# Patient Record
Sex: Female | Born: 1935 | Race: White | Hispanic: No | Marital: Single | Smoking: Never smoker
Health system: Southern US, Community
[De-identification: ages and names within clinical notes are randomized; demographics above are authoritative.]

## PROBLEM LIST (undated history)

## (undated) DIAGNOSIS — I10 Essential (primary) hypertension: Secondary | ICD-10-CM

## (undated) DIAGNOSIS — E7800 Pure hypercholesterolemia, unspecified: Secondary | ICD-10-CM

---

## 2016-01-30 DIAGNOSIS — J019 Acute sinusitis, unspecified: Secondary | ICD-10-CM | POA: Diagnosis not present

## 2016-02-14 DIAGNOSIS — I10 Essential (primary) hypertension: Secondary | ICD-10-CM | POA: Diagnosis not present

## 2016-02-14 DIAGNOSIS — E1165 Type 2 diabetes mellitus with hyperglycemia: Secondary | ICD-10-CM | POA: Diagnosis not present

## 2016-02-14 DIAGNOSIS — Z6827 Body mass index (BMI) 27.0-27.9, adult: Secondary | ICD-10-CM | POA: Diagnosis not present

## 2016-02-14 DIAGNOSIS — Z789 Other specified health status: Secondary | ICD-10-CM | POA: Diagnosis not present

## 2016-02-23 DIAGNOSIS — H40053 Ocular hypertension, bilateral: Secondary | ICD-10-CM | POA: Diagnosis not present

## 2016-03-15 DIAGNOSIS — H2513 Age-related nuclear cataract, bilateral: Secondary | ICD-10-CM | POA: Diagnosis not present

## 2016-03-15 DIAGNOSIS — H2512 Age-related nuclear cataract, left eye: Secondary | ICD-10-CM | POA: Diagnosis not present

## 2016-03-19 NOTE — Patient Instructions (Signed)
Your procedure is scheduled on:  03/26/2016   Report to [facility] at  75   AM.  Call this number if you have problems the morning of surgery: [phone]   Do not eat food or drink liquids :After Midnight.      Take these medicines the morning of surgery with A SIP OF WATER: none   Do not wear jewelry, make-up or nail polish.  Do not wear lotions, powders, or perfumes. You may wear deodorant.  Do not shave 48 hours prior to surgery.  Do not bring valuables to the hospital.  Contacts, dentures or bridgework may not be worn into surgery.  Leave suitcase in the car. After surgery it may be brought to your room.  For patients admitted to the hospital, checkout time is 11:00 AM the day of discharge.   Patients discharged the day of surgery will not be allowed to drive home.  :     Please read over the following fact sheets that you were given: Coughing and Deep Breathing, Surgical Site Infection Prevention, Anesthesia Post-op Instructions and Care and Recovery After Surgery    Cataract A cataract is a clouding of the lens of the eye. When a lens becomes cloudy, vision is reduced based on the degree and nature of the clouding. Many cataracts reduce vision to some degree. Some cataracts make people more near-sighted as they develop. Other cataracts increase glare. Cataracts that are ignored and become worse can sometimes look white. The white color can be seen through the pupil. CAUSES   Aging. However, cataracts may occur at any age, even in newborns.   Certain drugs.   Trauma to the eye.   Certain diseases such as diabetes.   Specific eye diseases such as chronic inflammation inside the eye or a sudden attack of a rare form of glaucoma.   Inherited or acquired medical problems.  SYMPTOMS   Gradual, progressive drop in vision in the affected eye.   Severe, rapid visual loss. This most often happens when trauma is the cause.  DIAGNOSIS  To detect a cataract, an eye doctor  examines the lens. Cataracts are best diagnosed with an exam of the eyes with the pupils enlarged (dilated) by drops.  TREATMENT  For an early cataract, vision may improve by using different eyeglasses or stronger lighting. If that does not help your vision, surgery is the only effective treatment. A cataract needs to be surgically removed when vision loss interferes with your everyday activities, such as driving, reading, or watching TV. A cataract may also have to be removed if it prevents examination or treatment of another eye problem. Surgery removes the cloudy lens and usually replaces it with a substitute lens (intraocular lens, IOL).  At a time when both you and your doctor agree, the cataract will be surgically removed. If you have cataracts in both eyes, only one is usually removed at a time. This allows the operated eye to heal and be out of danger from any possible problems after surgery (such as infection or poor wound healing). In rare cases, a cataract may be doing damage to your eye. In these cases, your caregiver may advise surgical removal right away. The vast majority of people who have cataract surgery have better vision afterward. HOME CARE INSTRUCTIONS  If you are not planning surgery, you may be asked to do the following:  Use different eyeglasses.   Use stronger or brighter lighting.   Ask your eye doctor about reducing your  medicine dose or changing medicines if it is thought that a medicine caused your cataract. Changing medicines does not make the cataract go away on its own.   Become familiar with your surroundings. Poor vision can lead to injury. Avoid bumping into things on the affected side. You are at a higher risk for tripping or falling.   Exercise extreme care when driving or operating machinery.   Wear sunglasses if you are sensitive to bright light or experiencing problems with glare.  SEEK IMMEDIATE MEDICAL CARE IF:   You have a worsening or sudden vision  loss.   You notice redness, swelling, or increasing pain in the eye.   You have a fever.  Document Released: 12/03/2005 Document Revised: 11/22/2011 Document Reviewed: 07/27/2011 [facility] Patient Information 2012 [employer].PATIENT INSTRUCTIONS POST-ANESTHESIA  IMMEDIATELY FOLLOWING SURGERY:  Do not drive or operate machinery for the first twenty four hours after surgery.  Do not make any important decisions for twenty four hours after surgery or while taking narcotic pain medications or sedatives.  If you develop intractable nausea and vomiting or a severe headache please notify your doctor immediately.  FOLLOW-UP:  Please make an appointment with your surgeon as instructed. You do not need to follow up with anesthesia unless specifically instructed to do so.  WOUND CARE INSTRUCTIONS (if applicable):  Keep a dry clean dressing on the anesthesia/puncture wound site if there is drainage.  Once the wound has quit draining you may leave it open to air.  Generally you should leave the bandage intact for twenty four hours unless there is drainage.  If the epidural site drains for more than 36-48 hours please call the anesthesia department.  QUESTIONS?:  Please feel free to call your physician or the hospital operator if you have any questions, and they will be happy to assist you.

## 2016-03-20 ENCOUNTER — Encounter (HOSPITAL_COMMUNITY)
Admission: RE | Admit: 2016-03-20 | Discharge: 2016-03-20 | Disposition: A | Discharge status: Home / Self Care | Payer: Medicare Other | Source: Ambulatory Visit | Attending: Ophthalmology | Admitting: Ophthalmology

## 2016-03-20 ENCOUNTER — Encounter (HOSPITAL_COMMUNITY): Payer: Self-pay

## 2016-03-20 ENCOUNTER — Other Ambulatory Visit: Payer: Self-pay

## 2016-03-20 DIAGNOSIS — I10 Essential (primary) hypertension: Secondary | ICD-10-CM | POA: Diagnosis not present

## 2016-03-20 DIAGNOSIS — H269 Unspecified cataract: Secondary | ICD-10-CM | POA: Insufficient documentation

## 2016-03-20 DIAGNOSIS — Z01812 Encounter for preprocedural laboratory examination: Secondary | ICD-10-CM | POA: Diagnosis not present

## 2016-03-20 DIAGNOSIS — E119 Type 2 diabetes mellitus without complications: Secondary | ICD-10-CM | POA: Diagnosis not present

## 2016-03-20 DIAGNOSIS — Z01810 Encounter for preprocedural cardiovascular examination: Secondary | ICD-10-CM | POA: Diagnosis not present

## 2016-03-20 HISTORY — DX: Pure hypercholesterolemia, unspecified: E78.00

## 2016-03-20 HISTORY — DX: Essential (primary) hypertension: I10

## 2016-03-20 LAB — BASIC METABOLIC PANEL
Anion gap: 9 (ref 5–15)
BUN: 26 mg/dL — AB (ref 6–20)
CALCIUM: 8.8 mg/dL — AB (ref 8.9–10.3)
CHLORIDE: 108 mmol/L (ref 101–111)
CO2: 24 mmol/L (ref 22–32)
CREATININE: 1.23 mg/dL — AB (ref 0.44–1.00)
GFR calc non Af Amer: 39 mL/min — ABNORMAL LOW (ref >60)
GFR, EST AFRICAN AMERICAN: 45 mL/min — AB (ref >60)
Glucose, Bld: 81 mg/dL (ref 65–99)
Potassium: 3.7 mmol/L (ref 3.5–5.1)
SODIUM: 141 mmol/L (ref 135–145)

## 2016-03-20 LAB — CBC
HCT: 37.9 % (ref 36.0–46.0)
Hemoglobin: 13.2 g/dL (ref 12.0–15.0)
MCH: 32.2 pg (ref 26.0–34.0)
MCHC: 34.8 g/dL (ref 30.0–36.0)
MCV: 92.4 fL (ref 78.0–100.0)
PLATELETS: 255 10*3/uL (ref 150–400)
RBC: 4.1 MIL/uL (ref 3.87–5.11)
RDW: 13.3 % (ref 11.5–15.5)
WBC: 9.1 10*3/uL (ref 4.0–10.5)

## 2016-03-26 ENCOUNTER — Encounter (HOSPITAL_COMMUNITY)
Admission: RE | Disposition: A | Discharge status: Home / Self Care | Payer: Self-pay | Source: Ambulatory Visit | Attending: Ophthalmology

## 2016-03-26 ENCOUNTER — Ambulatory Visit (HOSPITAL_COMMUNITY): Payer: Medicare Other | Admitting: Anesthesiology

## 2016-03-26 ENCOUNTER — Ambulatory Visit (HOSPITAL_COMMUNITY)
Admission: RE | Admit: 2016-03-26 | Discharge: 2016-03-26 | Disposition: A | Discharge status: Home / Self Care | Payer: Medicare Other | Source: Ambulatory Visit | Attending: Ophthalmology | Admitting: Ophthalmology

## 2016-03-26 DIAGNOSIS — Z7982 Long term (current) use of aspirin: Secondary | ICD-10-CM | POA: Diagnosis not present

## 2016-03-26 DIAGNOSIS — E119 Type 2 diabetes mellitus without complications: Secondary | ICD-10-CM | POA: Insufficient documentation

## 2016-03-26 DIAGNOSIS — H2512 Age-related nuclear cataract, left eye: Secondary | ICD-10-CM | POA: Insufficient documentation

## 2016-03-26 DIAGNOSIS — I10 Essential (primary) hypertension: Secondary | ICD-10-CM | POA: Diagnosis not present

## 2016-03-26 DIAGNOSIS — Z79899 Other long term (current) drug therapy: Secondary | ICD-10-CM | POA: Insufficient documentation

## 2016-03-26 DIAGNOSIS — H269 Unspecified cataract: Secondary | ICD-10-CM | POA: Diagnosis not present

## 2016-03-26 HISTORY — PX: CATARACT EXTRACTION W/PHACO: SHX586

## 2016-03-26 SURGERY — PHACOEMULSIFICATION, CATARACT, WITH IOL INSERTION
Anesthesia: Monitor Anesthesia Care | Site: Eye | Laterality: Left

## 2016-03-26 MED ORDER — LIDOCAINE 3.5 % OP GEL OPTIME - NO CHARGE
OPHTHALMIC | Status: DC | PRN
  Administered 2016-03-26: 2 [drp] via OPHTHALMIC

## 2016-03-26 MED ORDER — BSS IO SOLN
INTRAOCULAR | Status: DC | PRN
  Administered 2016-03-26: 15 mL

## 2016-03-26 MED ORDER — POVIDONE-IODINE 5 % OP SOLN
OPHTHALMIC | Status: DC | PRN
Start: 2016-03-26 — End: 2016-03-26
  Administered 2016-03-26: 1 via OPHTHALMIC

## 2016-03-26 MED ORDER — NEOMYCIN-POLYMYXIN-DEXAMETH 3.5-10000-0.1 OP SUSP
OPHTHALMIC | Status: DC | PRN
  Administered 2016-03-26: 2 [drp] via OPHTHALMIC

## 2016-03-26 MED ORDER — EPINEPHRINE HCL 1 MG/ML IJ SOLN
INTRAMUSCULAR | Status: AC
  Filled 2016-03-26: qty 1

## 2016-03-26 MED ORDER — CYCLOPENTOLATE-PHENYLEPHRINE 0.2-1 % OP SOLN
1.0000 [drp] | OPHTHALMIC | Status: AC
  Administered 2016-03-26 (×3): 1 [drp] via OPHTHALMIC

## 2016-03-26 MED ORDER — LIDOCAINE HCL 3.5 % OP GEL
1.0000 "application " | Freq: Once | OPHTHALMIC | Status: AC
  Administered 2016-03-26: 1 via OPHTHALMIC

## 2016-03-26 MED ORDER — PHENYLEPHRINE HCL 2.5 % OP SOLN
1.0000 [drp] | OPHTHALMIC | Status: AC
  Administered 2016-03-26 (×3): 1 [drp] via OPHTHALMIC

## 2016-03-26 MED ORDER — FENTANYL CITRATE (PF) 100 MCG/2ML IJ SOLN
25.0000 ug | Freq: Once | INTRAMUSCULAR | Status: AC
  Administered 2016-03-26: 25 ug via INTRAVENOUS

## 2016-03-26 MED ORDER — EPINEPHRINE HCL 1 MG/ML IJ SOLN
INTRAOCULAR | Status: DC | PRN
  Administered 2016-03-26: 500 mL

## 2016-03-26 MED ORDER — TETRACAINE HCL 0.5 % OP SOLN
1.0000 [drp] | OPHTHALMIC | Status: AC
  Administered 2016-03-26 (×3): 1 [drp] via OPHTHALMIC

## 2016-03-26 MED ORDER — MIDAZOLAM HCL 2 MG/2ML IJ SOLN
1.0000 mg | INTRAMUSCULAR | Status: DC | PRN
  Administered 2016-03-26: 2 mg via INTRAVENOUS

## 2016-03-26 MED ORDER — PROVISC 10 MG/ML IO SOLN
INTRAOCULAR | Status: DC | PRN
  Administered 2016-03-26: 0.85 mL via INTRAOCULAR

## 2016-03-26 MED ORDER — MIDAZOLAM HCL 2 MG/2ML IJ SOLN
INTRAMUSCULAR | Status: AC
  Filled 2016-03-26: qty 2

## 2016-03-26 MED ORDER — LACTATED RINGERS IV SOLN
INTRAVENOUS | Status: DC
  Administered 2016-03-26: 1000 mL via INTRAVENOUS

## 2016-03-26 MED ORDER — FENTANYL CITRATE (PF) 100 MCG/2ML IJ SOLN
INTRAMUSCULAR | Status: AC
  Filled 2016-03-26: qty 2

## 2016-03-26 MED ORDER — LIDOCAINE HCL (PF) 1 % IJ SOLN
INTRAMUSCULAR | Status: DC | PRN
  Administered 2016-03-26: .5 mL

## 2016-03-26 SURGICAL SUPPLY — 23 items
CAPSULAR TENSION RING-AMO (OPHTHALMIC RELATED) IMPLANT
CLOTH BEACON ORANGE TIMEOUT ST (SAFETY) ×3 IMPLANT
EYE SHIELD UNIVERSAL CLEAR (GAUZE/BANDAGES/DRESSINGS) ×3 IMPLANT
GLOVE BIOGEL PI IND STRL 7.0 (GLOVE) ×1 IMPLANT
GLOVE BIOGEL PI IND STRL 7.5 (GLOVE) IMPLANT
GLOVE BIOGEL PI INDICATOR 7.0 (GLOVE) ×2
GLOVE BIOGEL PI INDICATOR 7.5 (GLOVE)
GLOVE EXAM NITRILE LRG STRL (GLOVE) IMPLANT
GLOVE EXAM NITRILE MD LF STRL (GLOVE) IMPLANT
KIT VITRECTOMY (OPHTHALMIC RELATED) ×3 IMPLANT
PAD ARMBOARD 7.5X6 YLW CONV (MISCELLANEOUS) ×3 IMPLANT
PROC W NO LENS (INTRAOCULAR LENS)
PROC W SPEC LENS (INTRAOCULAR LENS)
PROCESS W NO LENS (INTRAOCULAR LENS) IMPLANT
PROCESS W SPEC LENS (INTRAOCULAR LENS) IMPLANT
RETRACTOR IRIS SIGHTPATH (OPHTHALMIC RELATED) IMPLANT
RING MALYGIN (MISCELLANEOUS) IMPLANT
SIGHTPATH CAT PROC W REG LENS (Ophthalmic Related) ×3 IMPLANT
SYRINGE LUER LOK 1CC (MISCELLANEOUS) ×3 IMPLANT
TAPE SURG TRANSPORE 1 IN (GAUZE/BANDAGES/DRESSINGS) ×1 IMPLANT
TAPE SURGICAL TRANSPORE 1 IN (GAUZE/BANDAGES/DRESSINGS) ×2
VISCOELASTIC ADDITIONAL (OPHTHALMIC RELATED) IMPLANT
WATER STERILE IRR 250ML POUR (IV SOLUTION) ×3 IMPLANT

## 2016-03-26 NOTE — Transfer of Care (Signed)
Immediate Anesthesia Transfer of Care Note  Patient: Dominique Farrell  Procedure(s) Performed: Procedure(s) with comments: CATARACT EXTRACTION PHACO AND INTRAOCULAR LENS PLACEMENT (IOC) (Left) - CDE 10.22  Patient Location: Short Stay  Anesthesia Type:MAC  Level of Consciousness: awake, alert , oriented and patient cooperative  Airway & Oxygen Therapy: Patient Spontanous Breathing  Post-op Assessment: Report given to RN, Post -op Vital signs reviewed and stable and Patient moving all extremities  Post vital signs: Reviewed and stable  Last Vitals:  Filed Vitals:   03/26/16 0720 03/26/16 0725  BP: 161/76 168/85  Temp:    Resp: 16 23    Complications: No apparent anesthesia complications

## 2016-03-26 NOTE — Op Note (Signed)
Date of Admission: 03/26/2016  Date of Surgery: 03/26/2016   Pre-Op Dx: Cataract Left Eye  Post-Op Dx: Senile Nuclear Cataract Left  Eye,  Dx Code H25.12  Surgeon: Tonny Branch, M.D.  Assistants: None  Anesthesia: Topical with MAC  Indications: Painless, progressive loss of vision with compromise of daily activities.  Surgery: Cataract Extraction with Intraocular lens Implant Left Eye  Discription: The patient had dilating drops and viscous lidocaine placed into the Left eye in the pre-op holding area. After transfer to the operating room, a time out was performed. The patient was then prepped and draped. Beginning with a 80 degree blade a paracentesis port was made at the surgeon's 2 o'clock position. The anterior chamber was then filled with 1% non-preserved lidocaine. This was followed by filling the anterior chamber with Provisc.  A 2.25mm keratome blade was used to make a clear corneal incision at the temporal limbus.  A bent cystatome needle was used to create a continuous tear capsulotomy. Hydrodissection was performed with balanced salt solution on a Fine canula. The lens nucleus was then removed using the phacoemulsification handpiece. Residual cortex was removed with the I&A handpiece. The anterior chamber and capsular bag were refilled with Provisc. A posterior chamber intraocular lens was placed into the capsular bag with it's injector. The implant was positioned with the Kuglan hook. The Provisc was then removed from the anterior chamber and capsular bag with the I&A handpiece. Stromal hydration of the main incision and paracentesis port was performed with BSS on a Fine canula. The wounds were tested for leak which was negative. The patient tolerated the procedure well. There were no operative complications. The patient was then transferred to the recovery room in stable condition.  Complications: None  Specimen: None  EBL: None  Prosthetic device: Hoya iSert 250, power 16.0 D, SN  [id number].

## 2016-03-26 NOTE — Discharge Instructions (Signed)

## 2016-03-26 NOTE — Anesthesia Preprocedure Evaluation (Signed)
Anesthesia Evaluation  Patient identified by MRN, date of birth, ID band Patient awake    Reviewed: Allergy & Precautions, NPO status , Patient's Chart, lab work & pertinent test results  Airway Mallampati: II  TM Distance: >3 FB     Dental  (+) Teeth Intact   Pulmonary    breath sounds clear to auscultation       Cardiovascular hypertension, Pt. on medications  Rhythm:Regular Rate:Normal     Neuro/Psych    GI/Hepatic negative GI ROS,   Endo/Other    Renal/GU      Musculoskeletal   Abdominal   Peds  Hematology   Anesthesia Other Findings   Reproductive/Obstetrics                             Anesthesia Physical Anesthesia Plan  ASA: II  Anesthesia Plan: MAC   Post-op Pain Management:    Induction: Intravenous  Airway Management Planned: Nasal Cannula  Additional Equipment:   Intra-op Plan:   Post-operative Plan:   Informed Consent: I have reviewed the patients History and Physical, chart, labs and discussed the procedure including the risks, benefits and alternatives for the proposed anesthesia with the patient or authorized representative who has indicated his/her understanding and acceptance.     Plan Discussed with:   Anesthesia Plan Comments:         Anesthesia Quick Evaluation

## 2016-03-26 NOTE — Consult Note (Signed)
I have reviewed the H&P, the patient was re-examined, and I have identified no interval changes in medical condition and plan of care since the history and physical of record  

## 2016-03-26 NOTE — Anesthesia Postprocedure Evaluation (Signed)
Anesthesia Post Note  Patient: Dominique Farrell  Procedure(s) Performed: Procedure(s) (LRB): CATARACT EXTRACTION PHACO AND INTRAOCULAR LENS PLACEMENT (IOC) (Left)  Patient location during evaluation: Short Stay Anesthesia Type: MAC Level of consciousness: awake and alert and oriented Pain management: pain level controlled Vital Signs Assessment: post-procedure vital signs reviewed and stable Respiratory status: respiratory function stable, nonlabored ventilation and spontaneous breathing Cardiovascular status: blood pressure returned to baseline and stable Postop Assessment: adequate PO intake and no signs of nausea or vomiting Anesthetic complications: no    Last Vitals:  Filed Vitals:   03/26/16 0720 03/26/16 0725  BP: 161/76 168/85  Temp:    Resp: 16 23    Last Pain: There were no vitals filed for this visit.               Shanitha Twining J

## 2016-03-27 ENCOUNTER — Encounter (HOSPITAL_COMMUNITY): Payer: Self-pay | Admitting: Ophthalmology

## 2016-04-02 DIAGNOSIS — Z961 Presence of intraocular lens: Secondary | ICD-10-CM | POA: Diagnosis not present

## 2016-04-02 DIAGNOSIS — H2511 Age-related nuclear cataract, right eye: Secondary | ICD-10-CM | POA: Diagnosis not present

## 2016-04-06 ENCOUNTER — Encounter (HOSPITAL_COMMUNITY)
Admission: RE | Admit: 2016-04-06 | Discharge: 2016-04-06 | Disposition: A | Discharge status: Home / Self Care | Payer: Medicare Other | Source: Ambulatory Visit | Attending: Ophthalmology | Admitting: Ophthalmology

## 2016-04-09 ENCOUNTER — Encounter (HOSPITAL_COMMUNITY): Admission: RE | Admit: 2016-04-09 | Payer: Medicare Other | Source: Ambulatory Visit

## 2016-04-12 ENCOUNTER — Ambulatory Visit (HOSPITAL_COMMUNITY): Payer: Medicare Other | Admitting: Anesthesiology

## 2016-04-12 ENCOUNTER — Encounter (HOSPITAL_COMMUNITY)
Admission: RE | Disposition: A | Discharge status: Home / Self Care | Payer: Self-pay | Source: Ambulatory Visit | Attending: Ophthalmology

## 2016-04-12 ENCOUNTER — Encounter (HOSPITAL_COMMUNITY): Payer: Self-pay | Admitting: *Deleted

## 2016-04-12 ENCOUNTER — Ambulatory Visit (HOSPITAL_COMMUNITY)
Admission: RE | Admit: 2016-04-12 | Discharge: 2016-04-12 | Disposition: A | Discharge status: Home / Self Care | Payer: Medicare Other | Source: Ambulatory Visit | Attending: Ophthalmology | Admitting: Ophthalmology

## 2016-04-12 DIAGNOSIS — H2511 Age-related nuclear cataract, right eye: Secondary | ICD-10-CM | POA: Insufficient documentation

## 2016-04-12 DIAGNOSIS — Z7982 Long term (current) use of aspirin: Secondary | ICD-10-CM | POA: Diagnosis not present

## 2016-04-12 DIAGNOSIS — Z79899 Other long term (current) drug therapy: Secondary | ICD-10-CM | POA: Diagnosis not present

## 2016-04-12 DIAGNOSIS — I10 Essential (primary) hypertension: Secondary | ICD-10-CM | POA: Diagnosis not present

## 2016-04-12 DIAGNOSIS — H269 Unspecified cataract: Secondary | ICD-10-CM | POA: Diagnosis not present

## 2016-04-12 HISTORY — PX: CATARACT EXTRACTION W/PHACO: SHX586

## 2016-04-12 SURGERY — PHACOEMULSIFICATION, CATARACT, WITH IOL INSERTION
Anesthesia: Monitor Anesthesia Care | Site: Eye | Laterality: Right

## 2016-04-12 MED ORDER — PHENYLEPHRINE HCL 2.5 % OP SOLN
1.0000 [drp] | OPHTHALMIC | Status: AC
  Administered 2016-04-12 (×3): 1 [drp] via OPHTHALMIC

## 2016-04-12 MED ORDER — CYCLOPENTOLATE-PHENYLEPHRINE 0.2-1 % OP SOLN
1.0000 [drp] | OPHTHALMIC | Status: AC
  Administered 2016-04-12 (×3): 1 [drp] via OPHTHALMIC

## 2016-04-12 MED ORDER — LACTATED RINGERS IV SOLN
INTRAVENOUS | Status: DC
  Administered 2016-04-12: 09:00:00 via INTRAVENOUS

## 2016-04-12 MED ORDER — MIDAZOLAM HCL 2 MG/2ML IJ SOLN
1.0000 mg | INTRAMUSCULAR | Status: DC | PRN
Start: 2016-04-12 — End: 2016-04-12
  Administered 2016-04-12 (×2): 2 mg via INTRAVENOUS
  Filled 2016-04-12: qty 2

## 2016-04-12 MED ORDER — FENTANYL CITRATE (PF) 100 MCG/2ML IJ SOLN
INTRAMUSCULAR | Status: AC
  Filled 2016-04-12: qty 2

## 2016-04-12 MED ORDER — LIDOCAINE HCL 3.5 % OP GEL
1.0000 "application " | Freq: Once | OPHTHALMIC | Status: AC
  Administered 2016-04-12: 1 via OPHTHALMIC

## 2016-04-12 MED ORDER — TETRACAINE HCL 0.5 % OP SOLN
1.0000 [drp] | OPHTHALMIC | Status: AC
  Administered 2016-04-12 (×3): 1 [drp] via OPHTHALMIC

## 2016-04-12 MED ORDER — LIDOCAINE HCL (PF) 1 % IJ SOLN
INTRAMUSCULAR | Status: DC | PRN
  Administered 2016-04-12: .4 mL

## 2016-04-12 MED ORDER — BSS IO SOLN
INTRAOCULAR | Status: DC | PRN
  Administered 2016-04-12: 15 mL

## 2016-04-12 MED ORDER — NEOMYCIN-POLYMYXIN-DEXAMETH 3.5-10000-0.1 OP SUSP
OPHTHALMIC | Status: DC | PRN
  Administered 2016-04-12: 2 [drp] via OPHTHALMIC

## 2016-04-12 MED ORDER — PROVISC 10 MG/ML IO SOLN
INTRAOCULAR | Status: DC | PRN
  Administered 2016-04-12: 0.85 mL via INTRAOCULAR

## 2016-04-12 MED ORDER — EPINEPHRINE HCL 1 MG/ML IJ SOLN
INTRAMUSCULAR | Status: AC
  Filled 2016-04-12: qty 1

## 2016-04-12 MED ORDER — FENTANYL CITRATE (PF) 100 MCG/2ML IJ SOLN
25.0000 ug | INTRAMUSCULAR | Status: AC
  Administered 2016-04-12 (×2): 25 ug via INTRAVENOUS

## 2016-04-12 MED ORDER — MIDAZOLAM HCL 2 MG/2ML IJ SOLN
INTRAMUSCULAR | Status: AC
  Filled 2016-04-12: qty 2

## 2016-04-12 MED ORDER — EPINEPHRINE HCL 1 MG/ML IJ SOLN
INTRAMUSCULAR | Status: DC | PRN
  Administered 2016-04-12: 500 mL

## 2016-04-12 MED ORDER — POVIDONE-IODINE 5 % OP SOLN
OPHTHALMIC | Status: DC | PRN
Start: 2016-04-12 — End: 2016-04-12
  Administered 2016-04-12: 1 via OPHTHALMIC

## 2016-04-12 SURGICAL SUPPLY — 23 items
CAPSULAR TENSION RING-AMO (OPHTHALMIC RELATED) IMPLANT
CLOTH BEACON ORANGE TIMEOUT ST (SAFETY) ×2 IMPLANT
EYE SHIELD UNIVERSAL CLEAR (GAUZE/BANDAGES/DRESSINGS) ×2 IMPLANT
GLOVE BIOGEL PI IND STRL 7.0 (GLOVE) ×1 IMPLANT
GLOVE BIOGEL PI IND STRL 7.5 (GLOVE) IMPLANT
GLOVE BIOGEL PI INDICATOR 7.0 (GLOVE) ×1
GLOVE BIOGEL PI INDICATOR 7.5 (GLOVE)
GLOVE EXAM NITRILE LRG STRL (GLOVE) IMPLANT
GLOVE EXAM NITRILE MD LF STRL (GLOVE) ×2 IMPLANT
KIT VITRECTOMY (OPHTHALMIC RELATED) IMPLANT
PAD ARMBOARD 7.5X6 YLW CONV (MISCELLANEOUS) ×2 IMPLANT
PROC W NO LENS (INTRAOCULAR LENS)
PROC W SPEC LENS (INTRAOCULAR LENS)
PROCESS W NO LENS (INTRAOCULAR LENS) IMPLANT
PROCESS W SPEC LENS (INTRAOCULAR LENS) IMPLANT
RETRACTOR IRIS SIGHTPATH (OPHTHALMIC RELATED) IMPLANT
RING MALYGIN (MISCELLANEOUS) IMPLANT
SIGHTPATH CAT PROC W REG LENS (Ophthalmic Related) ×2 IMPLANT
SYRINGE LUER LOK 1CC (MISCELLANEOUS) ×2 IMPLANT
TAPE SURG TRANSPORE 1 IN (GAUZE/BANDAGES/DRESSINGS) ×1 IMPLANT
TAPE SURGICAL TRANSPORE 1 IN (GAUZE/BANDAGES/DRESSINGS) ×1
VISCOELASTIC ADDITIONAL (OPHTHALMIC RELATED) IMPLANT
WATER STERILE IRR 250ML POUR (IV SOLUTION) ×2 IMPLANT

## 2016-04-12 NOTE — Discharge Instructions (Signed)
Cataract Surgery, Care After °Refer to this sheet in the next few weeks. These instructions provide you with information on caring for yourself after your procedure. Your caregiver may also give you more specific instructions. Your treatment has been planned according to current medical practices, but problems sometimes occur. Call your caregiver if you have any problems or questions after your procedure.  °HOME CARE INSTRUCTIONS  °· Avoid strenuous activities as directed by your caregiver. °· Ask your caregiver when you can resume driving. °· Use eyedrops or other medicines to help healing and control pressure inside your eye as directed by your caregiver. °· Only take over-the-counter or prescription medicines for pain, discomfort, or fever as directed by your caregiver. °· Do not to touch or rub your eyes. °· You may be instructed to use a protective shield during the first few days and nights after surgery. If not, wear sunglasses to protect your eyes. This is to protect the eye from pressure or from being accidentally bumped. °· Keep the area around your eye clean and dry. Avoid swimming or allowing water to hit you directly in the face while showering. Keep soap and shampoo out of your eyes. °· Do not bend or lift heavy objects. Bending increases pressure in the eye. You can walk, climb stairs, and do light household chores. °· Do not put a contact lens into the eye that had surgery until your caregiver says it is okay to do so. °· Ask your doctor when you can return to work. This will depend on the kind of work that you do. If you work in a dusty environment, you may be advised to wear protective eyewear for a period of time. °· Ask your caregiver when it will be safe to engage in sexual activity. °· Continue with your regular eye exams as directed by your caregiver. °What to expect: °· It is normal to feel itching and mild discomfort for a few days after cataract surgery. Some fluid discharge is also common,  and your eye may be sensitive to light and touch. °· After 1 to 2 days, even moderate discomfort should disappear. In most cases, healing will take about 6 weeks. °· If you received an intraocular lens (IOL), you may notice that colors are very bright or have a blue tinge. Also, if you have been in bright sunlight, everything may appear reddish for a few hours. If you see these color tinges, it is because your lens is clear and no longer cloudy. Within a few months after receiving an IOL, these extra colors should go away. When you have healed, you will probably need new glasses. °SEEK MEDICAL CARE IF:  °· You have increased bruising around your eye. °· You have discomfort not helped by medicine. °SEEK IMMEDIATE MEDICAL CARE IF:  °· You have a  fever. °· You have a worsening or sudden vision loss. °· You have redness, swelling, or increasing pain in the eye. °· You have a thick discharge from the eye that had surgery. °MAKE SURE YOU: °· Understand these instructions. °· Will watch your condition. °· Will get help right away if you are not doing well or get worse. °  °This information is not intended to replace advice given to you by your health care provider. Make sure you discuss any questions you have with your health care provider. °  °Document Released: 06/22/2005 Document Revised: 12/24/2014 Document Reviewed: 07/27/2011 °Elsevier Interactive Patient Education ©2016 [employer] ° °

## 2016-04-12 NOTE — H&P (Signed)
I have reviewed the H&P, the patient was re-examined, and I have identified no interval changes in medical condition and plan of care since the history and physical of record  

## 2016-04-12 NOTE — Transfer of Care (Signed)
Immediate Anesthesia Transfer of Care Note  Patient: Dominique Farrell  Procedure(s) Performed: Procedure(s) (LRB): CATARACT EXTRACTION PHACO AND INTRAOCULAR LENS PLACEMENT (IOC) (Right)  Patient Location: Shortstay  Anesthesia Type: MAC  Level of Consciousness: awake  Airway & Oxygen Therapy: Patient Spontanous Breathing   Post-op Assessment: Report given to PACU RN, Post -op Vital signs reviewed and stable and Patient moving all extremities  Post vital signs: Reviewed and stable  Complications: No apparent anesthesia complications

## 2016-04-12 NOTE — Addendum Note (Signed)
Addendum  created 04/12/16 1701 by Vista Deck, CRNA   Modules edited: Anesthesia Responsible Staff

## 2016-04-12 NOTE — Op Note (Signed)
Date of Admission: 04/12/2016  Date of Surgery: 04/12/2016   Pre-Op Dx: Cataract Right Eye  Post-Op Dx: Senile Nuclear Cataract Right  Eye,  Dx Code H25.11  Surgeon: Tonny Branch, M.D.  Assistants: None  Anesthesia: Topical with MAC  Indications: Painless, progressive loss of vision with compromise of daily activities.  Surgery: Cataract Extraction with Intraocular lens Implant Right Eye  Discription: The patient had dilating drops and viscous lidocaine placed into the Right eye in the pre-op holding area. After transfer to the operating room, a time out was performed. The patient was then prepped and draped. Beginning with a 6 degree blade a paracentesis port was made at the surgeon's 2 o'clock position. The anterior chamber was then filled with 1% non-preserved lidocaine. This was followed by filling the anterior chamber with Provisc.  A 2.82mm keratome blade was used to make a clear corneal incision at the temporal limbus.  A bent cystatome needle was used to create a continuous tear capsulotomy. Hydrodissection was performed with balanced salt solution on a Fine canula. The lens nucleus was then removed using the phacoemulsification handpiece. Residual cortex was removed with the I&A handpiece. The anterior chamber and capsular bag were refilled with Provisc. A posterior chamber intraocular lens was placed into the capsular bag with it's injector. The implant was positioned with the Kuglan hook. The Provisc was then removed from the anterior chamber and capsular bag with the I&A handpiece. Stromal hydration of the main incision and paracentesis port was performed with BSS on a Fine canula. The wounds were tested for leak which was negative. The patient tolerated the procedure well. There were no operative complications. The patient was then transferred to the recovery room in stable condition.  Complications: None  Specimen: None  EBL: None  Prosthetic device: Hoya iSert 250, power 17.5 D,  SN [id number].

## 2016-04-12 NOTE — Anesthesia Preprocedure Evaluation (Signed)
Anesthesia Evaluation  Patient identified by MRN, date of birth, ID band Patient awake    Reviewed: Allergy & Precautions, NPO status , Patient's Chart, lab work & pertinent test results  Airway Mallampati: II  TM Distance: >3 FB     Dental  (+) Teeth Intact   Pulmonary    breath sounds clear to auscultation       Cardiovascular hypertension, Pt. on medications  Rhythm:Regular Rate:Normal     Neuro/Psych    GI/Hepatic negative GI ROS,   Endo/Other    Renal/GU      Musculoskeletal   Abdominal   Peds  Hematology   Anesthesia Other Findings   Reproductive/Obstetrics                             Anesthesia Physical Anesthesia Plan  ASA: II  Anesthesia Plan: MAC   Post-op Pain Management:    Induction: Intravenous  Airway Management Planned: Nasal Cannula  Additional Equipment:   Intra-op Plan:   Post-operative Plan:   Informed Consent: I have reviewed the patients History and Physical, chart, labs and discussed the procedure including the risks, benefits and alternatives for the proposed anesthesia with the patient or authorized representative who has indicated his/her understanding and acceptance.     Plan Discussed with:   Anesthesia Plan Comments:         Anesthesia Quick Evaluation

## 2016-04-12 NOTE — Anesthesia Postprocedure Evaluation (Signed)
  Anesthesia Post-op Note  Patient: Dominique Farrell  Procedure(s) Performed: Procedure(s) (LRB): CATARACT EXTRACTION PHACO AND INTRAOCULAR LENS PLACEMENT (IOC) (Right)  Patient Location:  Short Stay  Anesthesia Type: MAC  Level of Consciousness: awake  Airway and Oxygen Therapy: Patient Spontanous Breathing  Post-op Pain: none  Post-op Assessment: Post-op Vital signs reviewed, Patient's Cardiovascular Status Stable, Respiratory Function Stable, Patent Airway, No signs of Nausea or vomiting and Pain level controlled  Post-op Vital Signs: Reviewed and stable  Complications: No apparent anesthesia complications

## 2016-04-13 ENCOUNTER — Encounter (HOSPITAL_COMMUNITY): Payer: Self-pay | Admitting: Ophthalmology

## 2016-04-26 DIAGNOSIS — E119 Type 2 diabetes mellitus without complications: Secondary | ICD-10-CM | POA: Diagnosis not present

## 2016-04-26 DIAGNOSIS — I10 Essential (primary) hypertension: Secondary | ICD-10-CM | POA: Diagnosis not present

## 2016-04-26 DIAGNOSIS — E7800 Pure hypercholesterolemia, unspecified: Secondary | ICD-10-CM | POA: Diagnosis not present

## 2016-05-22 DIAGNOSIS — N183 Chronic kidney disease, stage 3 (moderate): Secondary | ICD-10-CM | POA: Diagnosis not present

## 2016-05-22 DIAGNOSIS — E7800 Pure hypercholesterolemia, unspecified: Secondary | ICD-10-CM | POA: Diagnosis not present

## 2016-05-22 DIAGNOSIS — I10 Essential (primary) hypertension: Secondary | ICD-10-CM | POA: Diagnosis not present

## 2016-05-22 DIAGNOSIS — E1122 Type 2 diabetes mellitus with diabetic chronic kidney disease: Secondary | ICD-10-CM | POA: Diagnosis not present

## 2016-06-05 DIAGNOSIS — E119 Type 2 diabetes mellitus without complications: Secondary | ICD-10-CM | POA: Diagnosis not present

## 2016-06-05 DIAGNOSIS — I10 Essential (primary) hypertension: Secondary | ICD-10-CM | POA: Diagnosis not present

## 2016-06-05 DIAGNOSIS — E7800 Pure hypercholesterolemia, unspecified: Secondary | ICD-10-CM | POA: Diagnosis not present

## 2016-06-07 DIAGNOSIS — B351 Tinea unguium: Secondary | ICD-10-CM | POA: Diagnosis not present

## 2016-06-22 DIAGNOSIS — I10 Essential (primary) hypertension: Secondary | ICD-10-CM | POA: Diagnosis not present

## 2016-06-22 DIAGNOSIS — Z299 Encounter for prophylactic measures, unspecified: Secondary | ICD-10-CM | POA: Diagnosis not present

## 2016-06-22 DIAGNOSIS — N183 Chronic kidney disease, stage 3 (moderate): Secondary | ICD-10-CM | POA: Diagnosis not present

## 2016-06-22 DIAGNOSIS — M545 Low back pain: Secondary | ICD-10-CM | POA: Diagnosis not present

## 2016-06-22 DIAGNOSIS — E1122 Type 2 diabetes mellitus with diabetic chronic kidney disease: Secondary | ICD-10-CM | POA: Diagnosis not present

## 2016-06-22 DIAGNOSIS — E7800 Pure hypercholesterolemia, unspecified: Secondary | ICD-10-CM | POA: Diagnosis not present

## 2016-07-18 DIAGNOSIS — L820 Inflamed seborrheic keratosis: Secondary | ICD-10-CM | POA: Diagnosis not present

## 2016-07-18 DIAGNOSIS — L570 Actinic keratosis: Secondary | ICD-10-CM | POA: Diagnosis not present

## 2016-08-07 DIAGNOSIS — E7800 Pure hypercholesterolemia, unspecified: Secondary | ICD-10-CM | POA: Diagnosis not present

## 2016-08-07 DIAGNOSIS — Z299 Encounter for prophylactic measures, unspecified: Secondary | ICD-10-CM | POA: Diagnosis not present

## 2016-08-07 DIAGNOSIS — R5383 Other fatigue: Secondary | ICD-10-CM | POA: Diagnosis not present

## 2016-08-07 DIAGNOSIS — Z1389 Encounter for screening for other disorder: Secondary | ICD-10-CM | POA: Diagnosis not present

## 2016-08-07 DIAGNOSIS — Z7189 Other specified counseling: Secondary | ICD-10-CM | POA: Diagnosis not present

## 2016-08-07 DIAGNOSIS — Z0000 Encounter for general adult medical examination without abnormal findings: Secondary | ICD-10-CM | POA: Diagnosis not present

## 2016-08-07 DIAGNOSIS — E1122 Type 2 diabetes mellitus with diabetic chronic kidney disease: Secondary | ICD-10-CM | POA: Diagnosis not present

## 2016-08-07 DIAGNOSIS — Z1211 Encounter for screening for malignant neoplasm of colon: Secondary | ICD-10-CM | POA: Diagnosis not present

## 2016-08-07 DIAGNOSIS — E559 Vitamin D deficiency, unspecified: Secondary | ICD-10-CM | POA: Diagnosis not present

## 2016-08-22 DIAGNOSIS — Z23 Encounter for immunization: Secondary | ICD-10-CM | POA: Diagnosis not present

## 2016-10-05 DIAGNOSIS — I10 Essential (primary) hypertension: Secondary | ICD-10-CM | POA: Diagnosis not present

## 2016-10-05 DIAGNOSIS — E119 Type 2 diabetes mellitus without complications: Secondary | ICD-10-CM | POA: Diagnosis not present

## 2016-10-05 DIAGNOSIS — E7800 Pure hypercholesterolemia, unspecified: Secondary | ICD-10-CM | POA: Diagnosis not present

## 2016-11-05 DIAGNOSIS — E7800 Pure hypercholesterolemia, unspecified: Secondary | ICD-10-CM | POA: Diagnosis not present

## 2016-11-05 DIAGNOSIS — E119 Type 2 diabetes mellitus without complications: Secondary | ICD-10-CM | POA: Diagnosis not present

## 2016-11-05 DIAGNOSIS — I10 Essential (primary) hypertension: Secondary | ICD-10-CM | POA: Diagnosis not present

## 2016-11-15 DIAGNOSIS — E1122 Type 2 diabetes mellitus with diabetic chronic kidney disease: Secondary | ICD-10-CM | POA: Diagnosis not present

## 2016-11-15 DIAGNOSIS — I10 Essential (primary) hypertension: Secondary | ICD-10-CM | POA: Diagnosis not present

## 2016-11-15 DIAGNOSIS — Z299 Encounter for prophylactic measures, unspecified: Secondary | ICD-10-CM | POA: Diagnosis not present

## 2016-11-15 DIAGNOSIS — Z713 Dietary counseling and surveillance: Secondary | ICD-10-CM | POA: Diagnosis not present

## 2016-11-15 DIAGNOSIS — Z6827 Body mass index (BMI) 27.0-27.9, adult: Secondary | ICD-10-CM | POA: Diagnosis not present

## 2016-12-06 DIAGNOSIS — I10 Essential (primary) hypertension: Secondary | ICD-10-CM | POA: Diagnosis not present

## 2016-12-06 DIAGNOSIS — E7800 Pure hypercholesterolemia, unspecified: Secondary | ICD-10-CM | POA: Diagnosis not present

## 2016-12-06 DIAGNOSIS — E119 Type 2 diabetes mellitus without complications: Secondary | ICD-10-CM | POA: Diagnosis not present

## 2016-12-22 DIAGNOSIS — K859 Acute pancreatitis without necrosis or infection, unspecified: Secondary | ICD-10-CM | POA: Diagnosis not present

## 2016-12-22 DIAGNOSIS — E7800 Pure hypercholesterolemia, unspecified: Secondary | ICD-10-CM | POA: Diagnosis not present

## 2016-12-22 DIAGNOSIS — I10 Essential (primary) hypertension: Secondary | ICD-10-CM | POA: Diagnosis not present

## 2016-12-22 DIAGNOSIS — Z79899 Other long term (current) drug therapy: Secondary | ICD-10-CM | POA: Diagnosis not present

## 2016-12-22 DIAGNOSIS — R109 Unspecified abdominal pain: Secondary | ICD-10-CM | POA: Diagnosis not present

## 2016-12-22 DIAGNOSIS — Z7982 Long term (current) use of aspirin: Secondary | ICD-10-CM | POA: Diagnosis not present

## 2016-12-23 DIAGNOSIS — K859 Acute pancreatitis without necrosis or infection, unspecified: Secondary | ICD-10-CM | POA: Diagnosis not present

## 2016-12-23 DIAGNOSIS — I10 Essential (primary) hypertension: Secondary | ICD-10-CM | POA: Diagnosis not present

## 2016-12-23 DIAGNOSIS — Z79899 Other long term (current) drug therapy: Secondary | ICD-10-CM | POA: Diagnosis not present

## 2016-12-23 DIAGNOSIS — Z7982 Long term (current) use of aspirin: Secondary | ICD-10-CM | POA: Diagnosis not present

## 2016-12-23 DIAGNOSIS — N281 Cyst of kidney, acquired: Secondary | ICD-10-CM | POA: Diagnosis not present

## 2016-12-24 DIAGNOSIS — Z7982 Long term (current) use of aspirin: Secondary | ICD-10-CM | POA: Diagnosis not present

## 2016-12-24 DIAGNOSIS — I10 Essential (primary) hypertension: Secondary | ICD-10-CM | POA: Diagnosis not present

## 2016-12-24 DIAGNOSIS — Z79899 Other long term (current) drug therapy: Secondary | ICD-10-CM | POA: Diagnosis not present

## 2016-12-24 DIAGNOSIS — K859 Acute pancreatitis without necrosis or infection, unspecified: Secondary | ICD-10-CM | POA: Diagnosis not present

## 2016-12-26 DIAGNOSIS — E119 Type 2 diabetes mellitus without complications: Secondary | ICD-10-CM | POA: Diagnosis not present

## 2016-12-26 DIAGNOSIS — I10 Essential (primary) hypertension: Secondary | ICD-10-CM | POA: Diagnosis not present

## 2016-12-26 DIAGNOSIS — E7800 Pure hypercholesterolemia, unspecified: Secondary | ICD-10-CM | POA: Diagnosis not present

## 2016-12-27 DIAGNOSIS — E1122 Type 2 diabetes mellitus with diabetic chronic kidney disease: Secondary | ICD-10-CM | POA: Diagnosis not present

## 2016-12-27 DIAGNOSIS — N183 Chronic kidney disease, stage 3 (moderate): Secondary | ICD-10-CM | POA: Diagnosis not present

## 2016-12-27 DIAGNOSIS — E7800 Pure hypercholesterolemia, unspecified: Secondary | ICD-10-CM | POA: Diagnosis not present

## 2016-12-27 DIAGNOSIS — Z299 Encounter for prophylactic measures, unspecified: Secondary | ICD-10-CM | POA: Diagnosis not present

## 2016-12-27 DIAGNOSIS — I10 Essential (primary) hypertension: Secondary | ICD-10-CM | POA: Diagnosis not present

## 2016-12-27 DIAGNOSIS — Z6827 Body mass index (BMI) 27.0-27.9, adult: Secondary | ICD-10-CM | POA: Diagnosis not present

## 2016-12-27 DIAGNOSIS — Z789 Other specified health status: Secondary | ICD-10-CM | POA: Diagnosis not present

## 2017-02-08 DIAGNOSIS — E119 Type 2 diabetes mellitus without complications: Secondary | ICD-10-CM | POA: Diagnosis not present

## 2017-02-08 DIAGNOSIS — E7800 Pure hypercholesterolemia, unspecified: Secondary | ICD-10-CM | POA: Diagnosis not present

## 2017-02-08 DIAGNOSIS — I10 Essential (primary) hypertension: Secondary | ICD-10-CM | POA: Diagnosis not present

## 2017-02-20 DIAGNOSIS — N183 Chronic kidney disease, stage 3 (moderate): Secondary | ICD-10-CM | POA: Diagnosis not present

## 2017-02-20 DIAGNOSIS — Z789 Other specified health status: Secondary | ICD-10-CM | POA: Diagnosis not present

## 2017-02-20 DIAGNOSIS — M6530 Trigger finger, unspecified finger: Secondary | ICD-10-CM | POA: Diagnosis not present

## 2017-02-20 DIAGNOSIS — Z6827 Body mass index (BMI) 27.0-27.9, adult: Secondary | ICD-10-CM | POA: Diagnosis not present

## 2017-02-20 DIAGNOSIS — E1122 Type 2 diabetes mellitus with diabetic chronic kidney disease: Secondary | ICD-10-CM | POA: Diagnosis not present

## 2017-02-20 DIAGNOSIS — I10 Essential (primary) hypertension: Secondary | ICD-10-CM | POA: Diagnosis not present

## 2017-02-20 DIAGNOSIS — Z299 Encounter for prophylactic measures, unspecified: Secondary | ICD-10-CM | POA: Diagnosis not present

## 2017-02-20 DIAGNOSIS — E7800 Pure hypercholesterolemia, unspecified: Secondary | ICD-10-CM | POA: Diagnosis not present

## 2017-02-20 DIAGNOSIS — Z713 Dietary counseling and surveillance: Secondary | ICD-10-CM | POA: Diagnosis not present

## 2017-03-06 DIAGNOSIS — E7800 Pure hypercholesterolemia, unspecified: Secondary | ICD-10-CM | POA: Diagnosis not present

## 2017-03-06 DIAGNOSIS — E119 Type 2 diabetes mellitus without complications: Secondary | ICD-10-CM | POA: Diagnosis not present

## 2017-03-06 DIAGNOSIS — I10 Essential (primary) hypertension: Secondary | ICD-10-CM | POA: Diagnosis not present

## 2017-04-04 DIAGNOSIS — E7800 Pure hypercholesterolemia, unspecified: Secondary | ICD-10-CM | POA: Diagnosis not present

## 2017-04-04 DIAGNOSIS — I10 Essential (primary) hypertension: Secondary | ICD-10-CM | POA: Diagnosis not present

## 2017-04-04 DIAGNOSIS — E119 Type 2 diabetes mellitus without complications: Secondary | ICD-10-CM | POA: Diagnosis not present

## 2017-05-08 DIAGNOSIS — E7800 Pure hypercholesterolemia, unspecified: Secondary | ICD-10-CM | POA: Diagnosis not present

## 2017-05-08 DIAGNOSIS — E119 Type 2 diabetes mellitus without complications: Secondary | ICD-10-CM | POA: Diagnosis not present

## 2017-05-08 DIAGNOSIS — I10 Essential (primary) hypertension: Secondary | ICD-10-CM | POA: Diagnosis not present

## 2017-05-17 DIAGNOSIS — Z713 Dietary counseling and surveillance: Secondary | ICD-10-CM | POA: Diagnosis not present

## 2017-05-17 DIAGNOSIS — Z299 Encounter for prophylactic measures, unspecified: Secondary | ICD-10-CM | POA: Diagnosis not present

## 2017-05-17 DIAGNOSIS — Z6828 Body mass index (BMI) 28.0-28.9, adult: Secondary | ICD-10-CM | POA: Diagnosis not present

## 2017-05-17 DIAGNOSIS — E1122 Type 2 diabetes mellitus with diabetic chronic kidney disease: Secondary | ICD-10-CM | POA: Diagnosis not present

## 2017-05-17 DIAGNOSIS — E7800 Pure hypercholesterolemia, unspecified: Secondary | ICD-10-CM | POA: Diagnosis not present

## 2017-05-17 DIAGNOSIS — M1711 Unilateral primary osteoarthritis, right knee: Secondary | ICD-10-CM | POA: Diagnosis not present

## 2017-05-17 DIAGNOSIS — I10 Essential (primary) hypertension: Secondary | ICD-10-CM | POA: Diagnosis not present

## 2017-05-17 DIAGNOSIS — N183 Chronic kidney disease, stage 3 (moderate): Secondary | ICD-10-CM | POA: Diagnosis not present

## 2017-05-18 DIAGNOSIS — E785 Hyperlipidemia, unspecified: Secondary | ICD-10-CM | POA: Diagnosis not present

## 2017-05-18 DIAGNOSIS — Z79899 Other long term (current) drug therapy: Secondary | ICD-10-CM | POA: Diagnosis not present

## 2017-05-18 DIAGNOSIS — S40022A Contusion of left upper arm, initial encounter: Secondary | ICD-10-CM | POA: Diagnosis not present

## 2017-05-18 DIAGNOSIS — I10 Essential (primary) hypertension: Secondary | ICD-10-CM | POA: Diagnosis not present

## 2017-05-18 DIAGNOSIS — W01198A Fall on same level from slipping, tripping and stumbling with subsequent striking against other object, initial encounter: Secondary | ICD-10-CM | POA: Diagnosis not present

## 2017-05-18 DIAGNOSIS — Z7982 Long term (current) use of aspirin: Secondary | ICD-10-CM | POA: Diagnosis not present

## 2017-05-18 DIAGNOSIS — S8001XA Contusion of right knee, initial encounter: Secondary | ICD-10-CM | POA: Diagnosis not present

## 2017-05-24 DIAGNOSIS — L820 Inflamed seborrheic keratosis: Secondary | ICD-10-CM | POA: Diagnosis not present

## 2017-05-24 DIAGNOSIS — L219 Seborrheic dermatitis, unspecified: Secondary | ICD-10-CM | POA: Diagnosis not present

## 2017-05-24 DIAGNOSIS — L570 Actinic keratosis: Secondary | ICD-10-CM | POA: Diagnosis not present

## 2017-05-30 DIAGNOSIS — Z713 Dietary counseling and surveillance: Secondary | ICD-10-CM | POA: Diagnosis not present

## 2017-05-30 DIAGNOSIS — N183 Chronic kidney disease, stage 3 (moderate): Secondary | ICD-10-CM | POA: Diagnosis not present

## 2017-05-30 DIAGNOSIS — E1122 Type 2 diabetes mellitus with diabetic chronic kidney disease: Secondary | ICD-10-CM | POA: Diagnosis not present

## 2017-05-30 DIAGNOSIS — Z6828 Body mass index (BMI) 28.0-28.9, adult: Secondary | ICD-10-CM | POA: Diagnosis not present

## 2017-05-30 DIAGNOSIS — I10 Essential (primary) hypertension: Secondary | ICD-10-CM | POA: Diagnosis not present

## 2017-05-30 DIAGNOSIS — E785 Hyperlipidemia, unspecified: Secondary | ICD-10-CM | POA: Diagnosis not present

## 2017-05-30 DIAGNOSIS — Z299 Encounter for prophylactic measures, unspecified: Secondary | ICD-10-CM | POA: Diagnosis not present

## 2017-05-30 DIAGNOSIS — E7800 Pure hypercholesterolemia, unspecified: Secondary | ICD-10-CM | POA: Diagnosis not present

## 2017-07-25 DIAGNOSIS — E7800 Pure hypercholesterolemia, unspecified: Secondary | ICD-10-CM | POA: Diagnosis not present

## 2017-07-25 DIAGNOSIS — E119 Type 2 diabetes mellitus without complications: Secondary | ICD-10-CM | POA: Diagnosis not present

## 2017-07-25 DIAGNOSIS — I10 Essential (primary) hypertension: Secondary | ICD-10-CM | POA: Diagnosis not present

## 2017-08-09 DIAGNOSIS — E559 Vitamin D deficiency, unspecified: Secondary | ICD-10-CM | POA: Diagnosis not present

## 2017-08-09 DIAGNOSIS — E1122 Type 2 diabetes mellitus with diabetic chronic kidney disease: Secondary | ICD-10-CM | POA: Diagnosis not present

## 2017-08-09 DIAGNOSIS — Z1389 Encounter for screening for other disorder: Secondary | ICD-10-CM | POA: Diagnosis not present

## 2017-08-09 DIAGNOSIS — Z79899 Other long term (current) drug therapy: Secondary | ICD-10-CM | POA: Diagnosis not present

## 2017-08-09 DIAGNOSIS — N183 Chronic kidney disease, stage 3 (moderate): Secondary | ICD-10-CM | POA: Diagnosis not present

## 2017-08-09 DIAGNOSIS — Z1211 Encounter for screening for malignant neoplasm of colon: Secondary | ICD-10-CM | POA: Diagnosis not present

## 2017-08-09 DIAGNOSIS — I10 Essential (primary) hypertension: Secondary | ICD-10-CM | POA: Diagnosis not present

## 2017-08-09 DIAGNOSIS — R5383 Other fatigue: Secondary | ICD-10-CM | POA: Diagnosis not present

## 2017-08-09 DIAGNOSIS — E7800 Pure hypercholesterolemia, unspecified: Secondary | ICD-10-CM | POA: Diagnosis not present

## 2017-08-09 DIAGNOSIS — Z7189 Other specified counseling: Secondary | ICD-10-CM | POA: Diagnosis not present

## 2017-08-09 DIAGNOSIS — Z6827 Body mass index (BMI) 27.0-27.9, adult: Secondary | ICD-10-CM | POA: Diagnosis not present

## 2017-08-09 DIAGNOSIS — Z0000 Encounter for general adult medical examination without abnormal findings: Secondary | ICD-10-CM | POA: Diagnosis not present

## 2017-08-09 DIAGNOSIS — Z299 Encounter for prophylactic measures, unspecified: Secondary | ICD-10-CM | POA: Diagnosis not present

## 2017-08-22 DIAGNOSIS — Z23 Encounter for immunization: Secondary | ICD-10-CM | POA: Diagnosis not present

## 2017-09-05 DIAGNOSIS — E7800 Pure hypercholesterolemia, unspecified: Secondary | ICD-10-CM | POA: Diagnosis not present

## 2017-09-05 DIAGNOSIS — Z299 Encounter for prophylactic measures, unspecified: Secondary | ICD-10-CM | POA: Diagnosis not present

## 2017-09-05 DIAGNOSIS — Z713 Dietary counseling and surveillance: Secondary | ICD-10-CM | POA: Diagnosis not present

## 2017-09-05 DIAGNOSIS — E1165 Type 2 diabetes mellitus with hyperglycemia: Secondary | ICD-10-CM | POA: Diagnosis not present

## 2017-09-05 DIAGNOSIS — N183 Chronic kidney disease, stage 3 (moderate): Secondary | ICD-10-CM | POA: Diagnosis not present

## 2017-09-05 DIAGNOSIS — M1711 Unilateral primary osteoarthritis, right knee: Secondary | ICD-10-CM | POA: Diagnosis not present

## 2017-09-05 DIAGNOSIS — E1122 Type 2 diabetes mellitus with diabetic chronic kidney disease: Secondary | ICD-10-CM | POA: Diagnosis not present

## 2017-09-05 DIAGNOSIS — I10 Essential (primary) hypertension: Secondary | ICD-10-CM | POA: Diagnosis not present

## 2017-09-05 DIAGNOSIS — Z6828 Body mass index (BMI) 28.0-28.9, adult: Secondary | ICD-10-CM | POA: Diagnosis not present

## 2017-09-18 DIAGNOSIS — E119 Type 2 diabetes mellitus without complications: Secondary | ICD-10-CM | POA: Diagnosis not present

## 2017-09-18 DIAGNOSIS — E7800 Pure hypercholesterolemia, unspecified: Secondary | ICD-10-CM | POA: Diagnosis not present

## 2017-09-18 DIAGNOSIS — I10 Essential (primary) hypertension: Secondary | ICD-10-CM | POA: Diagnosis not present

## 2017-10-29 DIAGNOSIS — I10 Essential (primary) hypertension: Secondary | ICD-10-CM | POA: Diagnosis not present

## 2017-10-29 DIAGNOSIS — E119 Type 2 diabetes mellitus without complications: Secondary | ICD-10-CM | POA: Diagnosis not present

## 2017-10-29 DIAGNOSIS — E7800 Pure hypercholesterolemia, unspecified: Secondary | ICD-10-CM | POA: Diagnosis not present

## 2017-10-31 DIAGNOSIS — I10 Essential (primary) hypertension: Secondary | ICD-10-CM | POA: Diagnosis not present

## 2017-10-31 DIAGNOSIS — Z299 Encounter for prophylactic measures, unspecified: Secondary | ICD-10-CM | POA: Diagnosis not present

## 2017-10-31 DIAGNOSIS — E1165 Type 2 diabetes mellitus with hyperglycemia: Secondary | ICD-10-CM | POA: Diagnosis not present

## 2017-10-31 DIAGNOSIS — N183 Chronic kidney disease, stage 3 (moderate): Secondary | ICD-10-CM | POA: Diagnosis not present

## 2017-10-31 DIAGNOSIS — Z789 Other specified health status: Secondary | ICD-10-CM | POA: Diagnosis not present

## 2017-10-31 DIAGNOSIS — J069 Acute upper respiratory infection, unspecified: Secondary | ICD-10-CM | POA: Diagnosis not present

## 2017-10-31 DIAGNOSIS — Z6828 Body mass index (BMI) 28.0-28.9, adult: Secondary | ICD-10-CM | POA: Diagnosis not present

## 2017-10-31 DIAGNOSIS — E7800 Pure hypercholesterolemia, unspecified: Secondary | ICD-10-CM | POA: Diagnosis not present

## 2017-12-06 DIAGNOSIS — J189 Pneumonia, unspecified organism: Secondary | ICD-10-CM | POA: Diagnosis present

## 2017-12-06 DIAGNOSIS — Z7982 Long term (current) use of aspirin: Secondary | ICD-10-CM | POA: Diagnosis not present

## 2017-12-06 DIAGNOSIS — J181 Lobar pneumonia, unspecified organism: Secondary | ICD-10-CM | POA: Diagnosis not present

## 2017-12-06 DIAGNOSIS — J40 Bronchitis, not specified as acute or chronic: Secondary | ICD-10-CM | POA: Diagnosis present

## 2017-12-06 DIAGNOSIS — R0989 Other specified symptoms and signs involving the circulatory and respiratory systems: Secondary | ICD-10-CM | POA: Diagnosis not present

## 2017-12-06 DIAGNOSIS — E785 Hyperlipidemia, unspecified: Secondary | ICD-10-CM | POA: Diagnosis present

## 2017-12-06 DIAGNOSIS — Z79899 Other long term (current) drug therapy: Secondary | ICD-10-CM | POA: Diagnosis not present

## 2017-12-06 DIAGNOSIS — I10 Essential (primary) hypertension: Secondary | ICD-10-CM | POA: Diagnosis present

## 2017-12-06 DIAGNOSIS — R1111 Vomiting without nausea: Secondary | ICD-10-CM | POA: Diagnosis not present

## 2017-12-16 DIAGNOSIS — Z299 Encounter for prophylactic measures, unspecified: Secondary | ICD-10-CM | POA: Diagnosis not present

## 2017-12-16 DIAGNOSIS — Z6827 Body mass index (BMI) 27.0-27.9, adult: Secondary | ICD-10-CM | POA: Diagnosis not present

## 2017-12-16 DIAGNOSIS — E7800 Pure hypercholesterolemia, unspecified: Secondary | ICD-10-CM | POA: Diagnosis not present

## 2017-12-16 DIAGNOSIS — J189 Pneumonia, unspecified organism: Secondary | ICD-10-CM | POA: Diagnosis not present

## 2017-12-16 DIAGNOSIS — N183 Chronic kidney disease, stage 3 (moderate): Secondary | ICD-10-CM | POA: Diagnosis not present

## 2017-12-16 DIAGNOSIS — E1165 Type 2 diabetes mellitus with hyperglycemia: Secondary | ICD-10-CM | POA: Diagnosis not present

## 2017-12-24 DIAGNOSIS — E7800 Pure hypercholesterolemia, unspecified: Secondary | ICD-10-CM | POA: Diagnosis not present

## 2017-12-24 DIAGNOSIS — E119 Type 2 diabetes mellitus without complications: Secondary | ICD-10-CM | POA: Diagnosis not present

## 2017-12-24 DIAGNOSIS — I10 Essential (primary) hypertension: Secondary | ICD-10-CM | POA: Diagnosis not present

## 2018-01-21 DIAGNOSIS — H40053 Ocular hypertension, bilateral: Secondary | ICD-10-CM | POA: Diagnosis not present

## 2018-01-28 DIAGNOSIS — I10 Essential (primary) hypertension: Secondary | ICD-10-CM | POA: Diagnosis not present

## 2018-01-28 DIAGNOSIS — E7800 Pure hypercholesterolemia, unspecified: Secondary | ICD-10-CM | POA: Diagnosis not present

## 2018-01-28 DIAGNOSIS — E119 Type 2 diabetes mellitus without complications: Secondary | ICD-10-CM | POA: Diagnosis not present

## 2018-02-13 DIAGNOSIS — H26491 Other secondary cataract, right eye: Secondary | ICD-10-CM | POA: Diagnosis not present

## 2018-02-13 DIAGNOSIS — Z961 Presence of intraocular lens: Secondary | ICD-10-CM | POA: Diagnosis not present

## 2018-02-13 DIAGNOSIS — H26493 Other secondary cataract, bilateral: Secondary | ICD-10-CM | POA: Diagnosis not present

## 2018-02-18 DIAGNOSIS — I10 Essential (primary) hypertension: Secondary | ICD-10-CM | POA: Diagnosis not present

## 2018-02-18 DIAGNOSIS — H6090 Unspecified otitis externa, unspecified ear: Secondary | ICD-10-CM | POA: Diagnosis not present

## 2018-02-18 DIAGNOSIS — Z299 Encounter for prophylactic measures, unspecified: Secondary | ICD-10-CM | POA: Diagnosis not present

## 2018-02-18 DIAGNOSIS — Z87891 Personal history of nicotine dependence: Secondary | ICD-10-CM | POA: Diagnosis not present

## 2018-02-18 DIAGNOSIS — E1165 Type 2 diabetes mellitus with hyperglycemia: Secondary | ICD-10-CM | POA: Diagnosis not present

## 2018-02-18 DIAGNOSIS — Z6827 Body mass index (BMI) 27.0-27.9, adult: Secondary | ICD-10-CM | POA: Diagnosis not present

## 2018-02-20 DIAGNOSIS — H26492 Other secondary cataract, left eye: Secondary | ICD-10-CM | POA: Diagnosis not present

## 2018-03-07 DIAGNOSIS — I10 Essential (primary) hypertension: Secondary | ICD-10-CM | POA: Diagnosis not present

## 2018-03-07 DIAGNOSIS — E119 Type 2 diabetes mellitus without complications: Secondary | ICD-10-CM | POA: Diagnosis not present

## 2018-03-07 DIAGNOSIS — E7800 Pure hypercholesterolemia, unspecified: Secondary | ICD-10-CM | POA: Diagnosis not present

## 2018-03-19 DIAGNOSIS — Z299 Encounter for prophylactic measures, unspecified: Secondary | ICD-10-CM | POA: Diagnosis not present

## 2018-03-19 DIAGNOSIS — I10 Essential (primary) hypertension: Secondary | ICD-10-CM | POA: Diagnosis not present

## 2018-03-19 DIAGNOSIS — E1165 Type 2 diabetes mellitus with hyperglycemia: Secondary | ICD-10-CM | POA: Diagnosis not present

## 2018-03-19 DIAGNOSIS — E1122 Type 2 diabetes mellitus with diabetic chronic kidney disease: Secondary | ICD-10-CM | POA: Diagnosis not present

## 2018-03-19 DIAGNOSIS — N183 Chronic kidney disease, stage 3 (moderate): Secondary | ICD-10-CM | POA: Diagnosis not present

## 2018-03-19 DIAGNOSIS — Z6828 Body mass index (BMI) 28.0-28.9, adult: Secondary | ICD-10-CM | POA: Diagnosis not present

## 2018-05-15 DIAGNOSIS — E7800 Pure hypercholesterolemia, unspecified: Secondary | ICD-10-CM | POA: Diagnosis not present

## 2018-05-15 DIAGNOSIS — I10 Essential (primary) hypertension: Secondary | ICD-10-CM | POA: Diagnosis not present

## 2018-05-15 DIAGNOSIS — E119 Type 2 diabetes mellitus without complications: Secondary | ICD-10-CM | POA: Diagnosis not present

## 2018-05-27 DIAGNOSIS — L570 Actinic keratosis: Secondary | ICD-10-CM | POA: Diagnosis not present

## 2018-07-02 DIAGNOSIS — Z6827 Body mass index (BMI) 27.0-27.9, adult: Secondary | ICD-10-CM | POA: Diagnosis not present

## 2018-07-02 DIAGNOSIS — I10 Essential (primary) hypertension: Secondary | ICD-10-CM | POA: Diagnosis not present

## 2018-07-02 DIAGNOSIS — Z299 Encounter for prophylactic measures, unspecified: Secondary | ICD-10-CM | POA: Diagnosis not present

## 2018-07-02 DIAGNOSIS — E1165 Type 2 diabetes mellitus with hyperglycemia: Secondary | ICD-10-CM | POA: Diagnosis not present

## 2018-07-02 DIAGNOSIS — E1122 Type 2 diabetes mellitus with diabetic chronic kidney disease: Secondary | ICD-10-CM | POA: Diagnosis not present

## 2018-07-02 DIAGNOSIS — N183 Chronic kidney disease, stage 3 (moderate): Secondary | ICD-10-CM | POA: Diagnosis not present

## 2018-07-28 DIAGNOSIS — E119 Type 2 diabetes mellitus without complications: Secondary | ICD-10-CM | POA: Diagnosis not present

## 2018-07-28 DIAGNOSIS — E7800 Pure hypercholesterolemia, unspecified: Secondary | ICD-10-CM | POA: Diagnosis not present

## 2018-07-28 DIAGNOSIS — I10 Essential (primary) hypertension: Secondary | ICD-10-CM | POA: Diagnosis not present

## 2018-08-11 DIAGNOSIS — R5383 Other fatigue: Secondary | ICD-10-CM | POA: Diagnosis not present

## 2018-08-11 DIAGNOSIS — Z299 Encounter for prophylactic measures, unspecified: Secondary | ICD-10-CM | POA: Diagnosis not present

## 2018-08-11 DIAGNOSIS — Z7189 Other specified counseling: Secondary | ICD-10-CM | POA: Diagnosis not present

## 2018-08-11 DIAGNOSIS — E7800 Pure hypercholesterolemia, unspecified: Secondary | ICD-10-CM | POA: Diagnosis not present

## 2018-08-11 DIAGNOSIS — Z6828 Body mass index (BMI) 28.0-28.9, adult: Secondary | ICD-10-CM | POA: Diagnosis not present

## 2018-08-11 DIAGNOSIS — E559 Vitamin D deficiency, unspecified: Secondary | ICD-10-CM | POA: Diagnosis not present

## 2018-08-11 DIAGNOSIS — I10 Essential (primary) hypertension: Secondary | ICD-10-CM | POA: Diagnosis not present

## 2018-08-11 DIAGNOSIS — Z1339 Encounter for screening examination for other mental health and behavioral disorders: Secondary | ICD-10-CM | POA: Diagnosis not present

## 2018-08-11 DIAGNOSIS — Z1211 Encounter for screening for malignant neoplasm of colon: Secondary | ICD-10-CM | POA: Diagnosis not present

## 2018-08-11 DIAGNOSIS — Z1331 Encounter for screening for depression: Secondary | ICD-10-CM | POA: Diagnosis not present

## 2018-08-11 DIAGNOSIS — Z79899 Other long term (current) drug therapy: Secondary | ICD-10-CM | POA: Diagnosis not present

## 2018-08-11 DIAGNOSIS — Z0000 Encounter for general adult medical examination without abnormal findings: Secondary | ICD-10-CM | POA: Diagnosis not present

## 2018-08-26 DIAGNOSIS — Z23 Encounter for immunization: Secondary | ICD-10-CM | POA: Diagnosis not present

## 2018-09-02 DIAGNOSIS — I10 Essential (primary) hypertension: Secondary | ICD-10-CM | POA: Diagnosis not present

## 2018-09-02 DIAGNOSIS — E7800 Pure hypercholesterolemia, unspecified: Secondary | ICD-10-CM | POA: Diagnosis not present

## 2018-09-02 DIAGNOSIS — E119 Type 2 diabetes mellitus without complications: Secondary | ICD-10-CM | POA: Diagnosis not present

## 2018-10-02 DIAGNOSIS — E2839 Other primary ovarian failure: Secondary | ICD-10-CM | POA: Diagnosis not present

## 2018-10-07 DIAGNOSIS — E1165 Type 2 diabetes mellitus with hyperglycemia: Secondary | ICD-10-CM | POA: Diagnosis not present

## 2018-10-07 DIAGNOSIS — Z6828 Body mass index (BMI) 28.0-28.9, adult: Secondary | ICD-10-CM | POA: Diagnosis not present

## 2018-10-07 DIAGNOSIS — Z299 Encounter for prophylactic measures, unspecified: Secondary | ICD-10-CM | POA: Diagnosis not present

## 2018-10-07 DIAGNOSIS — I10 Essential (primary) hypertension: Secondary | ICD-10-CM | POA: Diagnosis not present

## 2018-10-07 DIAGNOSIS — Z713 Dietary counseling and surveillance: Secondary | ICD-10-CM | POA: Diagnosis not present

## 2018-10-22 DIAGNOSIS — E119 Type 2 diabetes mellitus without complications: Secondary | ICD-10-CM | POA: Diagnosis not present

## 2018-10-22 DIAGNOSIS — I10 Essential (primary) hypertension: Secondary | ICD-10-CM | POA: Diagnosis not present

## 2018-10-22 DIAGNOSIS — E7800 Pure hypercholesterolemia, unspecified: Secondary | ICD-10-CM | POA: Diagnosis not present

## 2018-11-18 DIAGNOSIS — E119 Type 2 diabetes mellitus without complications: Secondary | ICD-10-CM | POA: Diagnosis not present

## 2018-11-18 DIAGNOSIS — I10 Essential (primary) hypertension: Secondary | ICD-10-CM | POA: Diagnosis not present

## 2018-11-18 DIAGNOSIS — E7800 Pure hypercholesterolemia, unspecified: Secondary | ICD-10-CM | POA: Diagnosis not present

## 2018-12-18 DIAGNOSIS — R05 Cough: Secondary | ICD-10-CM | POA: Diagnosis not present

## 2018-12-18 DIAGNOSIS — Z789 Other specified health status: Secondary | ICD-10-CM | POA: Diagnosis not present

## 2018-12-18 DIAGNOSIS — Z299 Encounter for prophylactic measures, unspecified: Secondary | ICD-10-CM | POA: Diagnosis not present

## 2018-12-18 DIAGNOSIS — E1122 Type 2 diabetes mellitus with diabetic chronic kidney disease: Secondary | ICD-10-CM | POA: Diagnosis not present

## 2018-12-18 DIAGNOSIS — Z6828 Body mass index (BMI) 28.0-28.9, adult: Secondary | ICD-10-CM | POA: Diagnosis not present

## 2018-12-18 DIAGNOSIS — I10 Essential (primary) hypertension: Secondary | ICD-10-CM | POA: Diagnosis not present

## 2018-12-18 DIAGNOSIS — J069 Acute upper respiratory infection, unspecified: Secondary | ICD-10-CM | POA: Diagnosis not present

## 2018-12-18 DIAGNOSIS — E1165 Type 2 diabetes mellitus with hyperglycemia: Secondary | ICD-10-CM | POA: Diagnosis not present

## 2019-01-13 DIAGNOSIS — I10 Essential (primary) hypertension: Secondary | ICD-10-CM | POA: Diagnosis not present

## 2019-01-13 DIAGNOSIS — N183 Chronic kidney disease, stage 3 (moderate): Secondary | ICD-10-CM | POA: Diagnosis not present

## 2019-01-13 DIAGNOSIS — E1165 Type 2 diabetes mellitus with hyperglycemia: Secondary | ICD-10-CM | POA: Diagnosis not present

## 2019-01-13 DIAGNOSIS — Z789 Other specified health status: Secondary | ICD-10-CM | POA: Diagnosis not present

## 2019-01-13 DIAGNOSIS — Z6827 Body mass index (BMI) 27.0-27.9, adult: Secondary | ICD-10-CM | POA: Diagnosis not present

## 2019-01-13 DIAGNOSIS — E1122 Type 2 diabetes mellitus with diabetic chronic kidney disease: Secondary | ICD-10-CM | POA: Diagnosis not present

## 2019-01-13 DIAGNOSIS — Z299 Encounter for prophylactic measures, unspecified: Secondary | ICD-10-CM | POA: Diagnosis not present

## 2019-03-26 DIAGNOSIS — I10 Essential (primary) hypertension: Secondary | ICD-10-CM | POA: Diagnosis not present

## 2019-03-26 DIAGNOSIS — E119 Type 2 diabetes mellitus without complications: Secondary | ICD-10-CM | POA: Diagnosis not present

## 2019-03-26 DIAGNOSIS — E7800 Pure hypercholesterolemia, unspecified: Secondary | ICD-10-CM | POA: Diagnosis not present

## 2019-04-21 DIAGNOSIS — Z299 Encounter for prophylactic measures, unspecified: Secondary | ICD-10-CM | POA: Diagnosis not present

## 2019-04-21 DIAGNOSIS — Z6826 Body mass index (BMI) 26.0-26.9, adult: Secondary | ICD-10-CM | POA: Diagnosis not present

## 2019-04-21 DIAGNOSIS — N183 Chronic kidney disease, stage 3 (moderate): Secondary | ICD-10-CM | POA: Diagnosis not present

## 2019-04-21 DIAGNOSIS — E1165 Type 2 diabetes mellitus with hyperglycemia: Secondary | ICD-10-CM | POA: Diagnosis not present

## 2019-04-21 DIAGNOSIS — E1122 Type 2 diabetes mellitus with diabetic chronic kidney disease: Secondary | ICD-10-CM | POA: Diagnosis not present

## 2019-04-21 DIAGNOSIS — I10 Essential (primary) hypertension: Secondary | ICD-10-CM | POA: Diagnosis not present

## 2019-04-30 DIAGNOSIS — I10 Essential (primary) hypertension: Secondary | ICD-10-CM | POA: Diagnosis not present

## 2019-04-30 DIAGNOSIS — E119 Type 2 diabetes mellitus without complications: Secondary | ICD-10-CM | POA: Diagnosis not present

## 2019-04-30 DIAGNOSIS — E7800 Pure hypercholesterolemia, unspecified: Secondary | ICD-10-CM | POA: Diagnosis not present

## 2019-06-01 DIAGNOSIS — E119 Type 2 diabetes mellitus without complications: Secondary | ICD-10-CM | POA: Diagnosis not present

## 2019-06-01 DIAGNOSIS — E7800 Pure hypercholesterolemia, unspecified: Secondary | ICD-10-CM | POA: Diagnosis not present

## 2019-06-01 DIAGNOSIS — I10 Essential (primary) hypertension: Secondary | ICD-10-CM | POA: Diagnosis not present

## 2019-08-05 DIAGNOSIS — I10 Essential (primary) hypertension: Secondary | ICD-10-CM | POA: Diagnosis not present

## 2019-08-05 DIAGNOSIS — E7800 Pure hypercholesterolemia, unspecified: Secondary | ICD-10-CM | POA: Diagnosis not present

## 2019-08-05 DIAGNOSIS — E119 Type 2 diabetes mellitus without complications: Secondary | ICD-10-CM | POA: Diagnosis not present

## 2019-08-18 DIAGNOSIS — E559 Vitamin D deficiency, unspecified: Secondary | ICD-10-CM | POA: Diagnosis not present

## 2019-08-18 DIAGNOSIS — Z0000 Encounter for general adult medical examination without abnormal findings: Secondary | ICD-10-CM | POA: Diagnosis not present

## 2019-08-18 DIAGNOSIS — E1122 Type 2 diabetes mellitus with diabetic chronic kidney disease: Secondary | ICD-10-CM | POA: Diagnosis not present

## 2019-08-18 DIAGNOSIS — E785 Hyperlipidemia, unspecified: Secondary | ICD-10-CM | POA: Diagnosis not present

## 2019-08-18 DIAGNOSIS — Z299 Encounter for prophylactic measures, unspecified: Secondary | ICD-10-CM | POA: Diagnosis not present

## 2019-08-18 DIAGNOSIS — Z79899 Other long term (current) drug therapy: Secondary | ICD-10-CM | POA: Diagnosis not present

## 2019-08-18 DIAGNOSIS — Z6826 Body mass index (BMI) 26.0-26.9, adult: Secondary | ICD-10-CM | POA: Diagnosis not present

## 2019-08-18 DIAGNOSIS — E1165 Type 2 diabetes mellitus with hyperglycemia: Secondary | ICD-10-CM | POA: Diagnosis not present

## 2019-08-18 DIAGNOSIS — Z7189 Other specified counseling: Secondary | ICD-10-CM | POA: Diagnosis not present

## 2019-08-18 DIAGNOSIS — Z1331 Encounter for screening for depression: Secondary | ICD-10-CM | POA: Diagnosis not present

## 2019-08-18 DIAGNOSIS — R5383 Other fatigue: Secondary | ICD-10-CM | POA: Diagnosis not present

## 2019-08-18 DIAGNOSIS — Z1211 Encounter for screening for malignant neoplasm of colon: Secondary | ICD-10-CM | POA: Diagnosis not present

## 2019-08-18 DIAGNOSIS — Z1339 Encounter for screening examination for other mental health and behavioral disorders: Secondary | ICD-10-CM | POA: Diagnosis not present

## 2019-08-20 DIAGNOSIS — E119 Type 2 diabetes mellitus without complications: Secondary | ICD-10-CM | POA: Diagnosis not present

## 2019-08-20 DIAGNOSIS — I10 Essential (primary) hypertension: Secondary | ICD-10-CM | POA: Diagnosis not present

## 2019-08-20 DIAGNOSIS — E7800 Pure hypercholesterolemia, unspecified: Secondary | ICD-10-CM | POA: Diagnosis not present

## 2019-08-28 DIAGNOSIS — Z23 Encounter for immunization: Secondary | ICD-10-CM | POA: Diagnosis not present

## 2019-09-24 DIAGNOSIS — I10 Essential (primary) hypertension: Secondary | ICD-10-CM | POA: Diagnosis not present

## 2019-09-24 DIAGNOSIS — E119 Type 2 diabetes mellitus without complications: Secondary | ICD-10-CM | POA: Diagnosis not present

## 2019-09-24 DIAGNOSIS — E7800 Pure hypercholesterolemia, unspecified: Secondary | ICD-10-CM | POA: Diagnosis not present

## 2019-11-23 DIAGNOSIS — N183 Chronic kidney disease, stage 3 unspecified: Secondary | ICD-10-CM | POA: Diagnosis not present

## 2019-11-23 DIAGNOSIS — Z299 Encounter for prophylactic measures, unspecified: Secondary | ICD-10-CM | POA: Diagnosis not present

## 2019-11-23 DIAGNOSIS — Z6827 Body mass index (BMI) 27.0-27.9, adult: Secondary | ICD-10-CM | POA: Diagnosis not present

## 2019-11-23 DIAGNOSIS — E1165 Type 2 diabetes mellitus with hyperglycemia: Secondary | ICD-10-CM | POA: Diagnosis not present

## 2019-11-23 DIAGNOSIS — E1122 Type 2 diabetes mellitus with diabetic chronic kidney disease: Secondary | ICD-10-CM | POA: Diagnosis not present

## 2019-11-23 DIAGNOSIS — I10 Essential (primary) hypertension: Secondary | ICD-10-CM | POA: Diagnosis not present

## 2020-01-28 DIAGNOSIS — Z23 Encounter for immunization: Secondary | ICD-10-CM | POA: Diagnosis not present

## 2020-02-04 DIAGNOSIS — I10 Essential (primary) hypertension: Secondary | ICD-10-CM | POA: Diagnosis not present

## 2020-02-04 DIAGNOSIS — E119 Type 2 diabetes mellitus without complications: Secondary | ICD-10-CM | POA: Diagnosis not present

## 2020-02-04 DIAGNOSIS — E7800 Pure hypercholesterolemia, unspecified: Secondary | ICD-10-CM | POA: Diagnosis not present

## 2020-02-24 DIAGNOSIS — Z789 Other specified health status: Secondary | ICD-10-CM | POA: Diagnosis not present

## 2020-02-24 DIAGNOSIS — E1122 Type 2 diabetes mellitus with diabetic chronic kidney disease: Secondary | ICD-10-CM | POA: Diagnosis not present

## 2020-02-24 DIAGNOSIS — Z299 Encounter for prophylactic measures, unspecified: Secondary | ICD-10-CM | POA: Diagnosis not present

## 2020-02-24 DIAGNOSIS — I10 Essential (primary) hypertension: Secondary | ICD-10-CM | POA: Diagnosis not present

## 2020-02-24 DIAGNOSIS — E1165 Type 2 diabetes mellitus with hyperglycemia: Secondary | ICD-10-CM | POA: Diagnosis not present

## 2020-02-24 DIAGNOSIS — Z6827 Body mass index (BMI) 27.0-27.9, adult: Secondary | ICD-10-CM | POA: Diagnosis not present

## 2020-02-24 DIAGNOSIS — D692 Other nonthrombocytopenic purpura: Secondary | ICD-10-CM | POA: Diagnosis not present

## 2020-02-26 DIAGNOSIS — Z23 Encounter for immunization: Secondary | ICD-10-CM | POA: Diagnosis not present

## 2020-03-09 DIAGNOSIS — E7800 Pure hypercholesterolemia, unspecified: Secondary | ICD-10-CM | POA: Diagnosis not present

## 2020-03-09 DIAGNOSIS — I10 Essential (primary) hypertension: Secondary | ICD-10-CM | POA: Diagnosis not present

## 2020-03-09 DIAGNOSIS — E119 Type 2 diabetes mellitus without complications: Secondary | ICD-10-CM | POA: Diagnosis not present

## 2020-04-13 DIAGNOSIS — S4992XA Unspecified injury of left shoulder and upper arm, initial encounter: Secondary | ICD-10-CM | POA: Diagnosis not present

## 2020-04-13 DIAGNOSIS — S299XXA Unspecified injury of thorax, initial encounter: Secondary | ICD-10-CM | POA: Diagnosis not present

## 2020-04-13 DIAGNOSIS — R58 Hemorrhage, not elsewhere classified: Secondary | ICD-10-CM | POA: Diagnosis not present

## 2020-04-13 DIAGNOSIS — I10 Essential (primary) hypertension: Secondary | ICD-10-CM | POA: Diagnosis not present

## 2020-04-13 DIAGNOSIS — Z23 Encounter for immunization: Secondary | ICD-10-CM | POA: Diagnosis not present

## 2020-04-13 DIAGNOSIS — S8992XA Unspecified injury of left lower leg, initial encounter: Secondary | ICD-10-CM | POA: Diagnosis not present

## 2020-04-13 DIAGNOSIS — M25531 Pain in right wrist: Secondary | ICD-10-CM | POA: Diagnosis not present

## 2020-04-13 DIAGNOSIS — Z7982 Long term (current) use of aspirin: Secondary | ICD-10-CM | POA: Diagnosis not present

## 2020-04-13 DIAGNOSIS — W1839XA Other fall on same level, initial encounter: Secondary | ICD-10-CM | POA: Diagnosis not present

## 2020-04-13 DIAGNOSIS — S6991XA Unspecified injury of right wrist, hand and finger(s), initial encounter: Secondary | ICD-10-CM | POA: Diagnosis not present

## 2020-04-13 DIAGNOSIS — Z79899 Other long term (current) drug therapy: Secondary | ICD-10-CM | POA: Diagnosis not present

## 2020-04-13 DIAGNOSIS — T68XXXA Hypothermia, initial encounter: Secondary | ICD-10-CM | POA: Diagnosis not present

## 2020-04-13 DIAGNOSIS — S81812A Laceration without foreign body, left lower leg, initial encounter: Secondary | ICD-10-CM | POA: Diagnosis not present

## 2020-04-13 DIAGNOSIS — E785 Hyperlipidemia, unspecified: Secondary | ICD-10-CM | POA: Diagnosis not present

## 2020-04-22 DIAGNOSIS — E7800 Pure hypercholesterolemia, unspecified: Secondary | ICD-10-CM | POA: Diagnosis not present

## 2020-04-22 DIAGNOSIS — E1165 Type 2 diabetes mellitus with hyperglycemia: Secondary | ICD-10-CM | POA: Diagnosis not present

## 2020-04-22 DIAGNOSIS — E1122 Type 2 diabetes mellitus with diabetic chronic kidney disease: Secondary | ICD-10-CM | POA: Diagnosis not present

## 2020-04-22 DIAGNOSIS — S81819A Laceration without foreign body, unspecified lower leg, initial encounter: Secondary | ICD-10-CM | POA: Diagnosis not present

## 2020-04-22 DIAGNOSIS — Z299 Encounter for prophylactic measures, unspecified: Secondary | ICD-10-CM | POA: Diagnosis not present

## 2020-04-22 DIAGNOSIS — I10 Essential (primary) hypertension: Secondary | ICD-10-CM | POA: Diagnosis not present

## 2020-04-25 DIAGNOSIS — N183 Chronic kidney disease, stage 3 unspecified: Secondary | ICD-10-CM | POA: Diagnosis not present

## 2020-04-25 DIAGNOSIS — S81802A Unspecified open wound, left lower leg, initial encounter: Secondary | ICD-10-CM | POA: Diagnosis not present

## 2020-04-25 DIAGNOSIS — E1122 Type 2 diabetes mellitus with diabetic chronic kidney disease: Secondary | ICD-10-CM | POA: Diagnosis not present

## 2020-04-25 DIAGNOSIS — Z299 Encounter for prophylactic measures, unspecified: Secondary | ICD-10-CM | POA: Diagnosis not present

## 2020-04-25 DIAGNOSIS — E1165 Type 2 diabetes mellitus with hyperglycemia: Secondary | ICD-10-CM | POA: Diagnosis not present

## 2020-04-25 DIAGNOSIS — I10 Essential (primary) hypertension: Secondary | ICD-10-CM | POA: Diagnosis not present

## 2020-05-10 DIAGNOSIS — Z299 Encounter for prophylactic measures, unspecified: Secondary | ICD-10-CM | POA: Diagnosis not present

## 2020-05-10 DIAGNOSIS — I10 Essential (primary) hypertension: Secondary | ICD-10-CM | POA: Diagnosis not present

## 2020-05-10 DIAGNOSIS — E1165 Type 2 diabetes mellitus with hyperglycemia: Secondary | ICD-10-CM | POA: Diagnosis not present

## 2020-05-10 DIAGNOSIS — S81802A Unspecified open wound, left lower leg, initial encounter: Secondary | ICD-10-CM | POA: Diagnosis not present

## 2020-05-10 DIAGNOSIS — Z6826 Body mass index (BMI) 26.0-26.9, adult: Secondary | ICD-10-CM | POA: Diagnosis not present

## 2020-05-10 DIAGNOSIS — E1129 Type 2 diabetes mellitus with other diabetic kidney complication: Secondary | ICD-10-CM | POA: Diagnosis not present

## 2020-05-15 DIAGNOSIS — E119 Type 2 diabetes mellitus without complications: Secondary | ICD-10-CM | POA: Diagnosis not present

## 2020-05-15 DIAGNOSIS — I10 Essential (primary) hypertension: Secondary | ICD-10-CM | POA: Diagnosis not present

## 2020-05-15 DIAGNOSIS — E7800 Pure hypercholesterolemia, unspecified: Secondary | ICD-10-CM | POA: Diagnosis not present

## 2020-05-17 DIAGNOSIS — E1165 Type 2 diabetes mellitus with hyperglycemia: Secondary | ICD-10-CM | POA: Diagnosis not present

## 2020-05-17 DIAGNOSIS — E7800 Pure hypercholesterolemia, unspecified: Secondary | ICD-10-CM | POA: Diagnosis not present

## 2020-05-17 DIAGNOSIS — R05 Cough: Secondary | ICD-10-CM | POA: Diagnosis not present

## 2020-05-17 DIAGNOSIS — Z6826 Body mass index (BMI) 26.0-26.9, adult: Secondary | ICD-10-CM | POA: Diagnosis not present

## 2020-05-17 DIAGNOSIS — Z299 Encounter for prophylactic measures, unspecified: Secondary | ICD-10-CM | POA: Diagnosis not present

## 2020-05-17 DIAGNOSIS — I10 Essential (primary) hypertension: Secondary | ICD-10-CM | POA: Diagnosis not present

## 2020-06-01 DIAGNOSIS — I10 Essential (primary) hypertension: Secondary | ICD-10-CM | POA: Diagnosis not present

## 2020-06-01 DIAGNOSIS — S81802A Unspecified open wound, left lower leg, initial encounter: Secondary | ICD-10-CM | POA: Diagnosis not present

## 2020-06-01 DIAGNOSIS — Z299 Encounter for prophylactic measures, unspecified: Secondary | ICD-10-CM | POA: Diagnosis not present

## 2020-06-01 DIAGNOSIS — E1122 Type 2 diabetes mellitus with diabetic chronic kidney disease: Secondary | ICD-10-CM | POA: Diagnosis not present

## 2020-06-01 DIAGNOSIS — N183 Chronic kidney disease, stage 3 unspecified: Secondary | ICD-10-CM | POA: Diagnosis not present

## 2020-06-01 DIAGNOSIS — E1165 Type 2 diabetes mellitus with hyperglycemia: Secondary | ICD-10-CM | POA: Diagnosis not present

## 2020-06-15 DIAGNOSIS — E7800 Pure hypercholesterolemia, unspecified: Secondary | ICD-10-CM | POA: Diagnosis not present

## 2020-06-15 DIAGNOSIS — I10 Essential (primary) hypertension: Secondary | ICD-10-CM | POA: Diagnosis not present

## 2020-06-15 DIAGNOSIS — E119 Type 2 diabetes mellitus without complications: Secondary | ICD-10-CM | POA: Diagnosis not present

## 2020-07-11 DIAGNOSIS — L0390 Cellulitis, unspecified: Secondary | ICD-10-CM | POA: Diagnosis not present

## 2020-07-11 DIAGNOSIS — I10 Essential (primary) hypertension: Secondary | ICD-10-CM | POA: Diagnosis not present

## 2020-07-11 DIAGNOSIS — S81819A Laceration without foreign body, unspecified lower leg, initial encounter: Secondary | ICD-10-CM | POA: Diagnosis not present

## 2020-07-11 DIAGNOSIS — Z299 Encounter for prophylactic measures, unspecified: Secondary | ICD-10-CM | POA: Diagnosis not present

## 2020-07-11 DIAGNOSIS — N183 Chronic kidney disease, stage 3 unspecified: Secondary | ICD-10-CM | POA: Diagnosis not present

## 2020-07-11 DIAGNOSIS — Z6826 Body mass index (BMI) 26.0-26.9, adult: Secondary | ICD-10-CM | POA: Diagnosis not present

## 2020-07-13 DIAGNOSIS — H40053 Ocular hypertension, bilateral: Secondary | ICD-10-CM | POA: Diagnosis not present

## 2020-07-15 DIAGNOSIS — E119 Type 2 diabetes mellitus without complications: Secondary | ICD-10-CM | POA: Diagnosis not present

## 2020-07-15 DIAGNOSIS — I10 Essential (primary) hypertension: Secondary | ICD-10-CM | POA: Diagnosis not present

## 2020-07-15 DIAGNOSIS — E7800 Pure hypercholesterolemia, unspecified: Secondary | ICD-10-CM | POA: Diagnosis not present

## 2020-07-20 DIAGNOSIS — Z6825 Body mass index (BMI) 25.0-25.9, adult: Secondary | ICD-10-CM | POA: Diagnosis not present

## 2020-07-20 DIAGNOSIS — E1122 Type 2 diabetes mellitus with diabetic chronic kidney disease: Secondary | ICD-10-CM | POA: Diagnosis not present

## 2020-07-20 DIAGNOSIS — Z299 Encounter for prophylactic measures, unspecified: Secondary | ICD-10-CM | POA: Diagnosis not present

## 2020-07-20 DIAGNOSIS — S81802A Unspecified open wound, left lower leg, initial encounter: Secondary | ICD-10-CM | POA: Diagnosis not present

## 2020-07-20 DIAGNOSIS — I10 Essential (primary) hypertension: Secondary | ICD-10-CM | POA: Diagnosis not present

## 2020-07-25 DIAGNOSIS — I10 Essential (primary) hypertension: Secondary | ICD-10-CM | POA: Diagnosis not present

## 2020-07-25 DIAGNOSIS — E1165 Type 2 diabetes mellitus with hyperglycemia: Secondary | ICD-10-CM | POA: Diagnosis not present

## 2020-07-25 DIAGNOSIS — Z299 Encounter for prophylactic measures, unspecified: Secondary | ICD-10-CM | POA: Diagnosis not present

## 2020-07-25 DIAGNOSIS — R21 Rash and other nonspecific skin eruption: Secondary | ICD-10-CM | POA: Diagnosis not present

## 2020-07-25 DIAGNOSIS — D692 Other nonthrombocytopenic purpura: Secondary | ICD-10-CM | POA: Diagnosis not present

## 2020-07-25 DIAGNOSIS — Z6825 Body mass index (BMI) 25.0-25.9, adult: Secondary | ICD-10-CM | POA: Diagnosis not present

## 2020-07-28 DIAGNOSIS — Z299 Encounter for prophylactic measures, unspecified: Secondary | ICD-10-CM | POA: Diagnosis not present

## 2020-07-28 DIAGNOSIS — Z713 Dietary counseling and surveillance: Secondary | ICD-10-CM | POA: Diagnosis not present

## 2020-07-28 DIAGNOSIS — S81802A Unspecified open wound, left lower leg, initial encounter: Secondary | ICD-10-CM | POA: Diagnosis not present

## 2020-07-28 DIAGNOSIS — Z6825 Body mass index (BMI) 25.0-25.9, adult: Secondary | ICD-10-CM | POA: Diagnosis not present

## 2020-07-28 DIAGNOSIS — I10 Essential (primary) hypertension: Secondary | ICD-10-CM | POA: Diagnosis not present

## 2020-08-03 DIAGNOSIS — E7800 Pure hypercholesterolemia, unspecified: Secondary | ICD-10-CM | POA: Diagnosis not present

## 2020-08-03 DIAGNOSIS — I10 Essential (primary) hypertension: Secondary | ICD-10-CM | POA: Diagnosis not present

## 2020-08-03 DIAGNOSIS — E119 Type 2 diabetes mellitus without complications: Secondary | ICD-10-CM | POA: Diagnosis not present

## 2020-08-24 DIAGNOSIS — Z1331 Encounter for screening for depression: Secondary | ICD-10-CM | POA: Diagnosis not present

## 2020-08-24 DIAGNOSIS — Z1339 Encounter for screening examination for other mental health and behavioral disorders: Secondary | ICD-10-CM | POA: Diagnosis not present

## 2020-08-24 DIAGNOSIS — Z0000 Encounter for general adult medical examination without abnormal findings: Secondary | ICD-10-CM | POA: Diagnosis not present

## 2020-08-24 DIAGNOSIS — Z299 Encounter for prophylactic measures, unspecified: Secondary | ICD-10-CM | POA: Diagnosis not present

## 2020-08-24 DIAGNOSIS — Z7189 Other specified counseling: Secondary | ICD-10-CM | POA: Diagnosis not present

## 2020-08-24 DIAGNOSIS — Z79899 Other long term (current) drug therapy: Secondary | ICD-10-CM | POA: Diagnosis not present

## 2020-08-24 DIAGNOSIS — Z6825 Body mass index (BMI) 25.0-25.9, adult: Secondary | ICD-10-CM | POA: Diagnosis not present

## 2020-08-24 DIAGNOSIS — E559 Vitamin D deficiency, unspecified: Secondary | ICD-10-CM | POA: Diagnosis not present

## 2020-08-24 DIAGNOSIS — R21 Rash and other nonspecific skin eruption: Secondary | ICD-10-CM | POA: Diagnosis not present

## 2020-08-24 DIAGNOSIS — R5383 Other fatigue: Secondary | ICD-10-CM | POA: Diagnosis not present

## 2020-08-24 DIAGNOSIS — E7800 Pure hypercholesterolemia, unspecified: Secondary | ICD-10-CM | POA: Diagnosis not present

## 2020-09-15 DIAGNOSIS — E119 Type 2 diabetes mellitus without complications: Secondary | ICD-10-CM | POA: Diagnosis not present

## 2020-09-15 DIAGNOSIS — I10 Essential (primary) hypertension: Secondary | ICD-10-CM | POA: Diagnosis not present

## 2020-09-15 DIAGNOSIS — E7800 Pure hypercholesterolemia, unspecified: Secondary | ICD-10-CM | POA: Diagnosis not present

## 2020-09-16 DIAGNOSIS — Z23 Encounter for immunization: Secondary | ICD-10-CM | POA: Diagnosis not present

## 2020-10-03 DIAGNOSIS — S81802A Unspecified open wound, left lower leg, initial encounter: Secondary | ICD-10-CM | POA: Diagnosis not present

## 2020-10-03 DIAGNOSIS — I10 Essential (primary) hypertension: Secondary | ICD-10-CM | POA: Diagnosis not present

## 2020-10-03 DIAGNOSIS — Z299 Encounter for prophylactic measures, unspecified: Secondary | ICD-10-CM | POA: Diagnosis not present

## 2020-10-12 DIAGNOSIS — R609 Edema, unspecified: Secondary | ICD-10-CM | POA: Diagnosis not present

## 2020-10-12 DIAGNOSIS — I10 Essential (primary) hypertension: Secondary | ICD-10-CM | POA: Diagnosis not present

## 2020-10-12 DIAGNOSIS — R600 Localized edema: Secondary | ICD-10-CM | POA: Diagnosis not present

## 2020-10-12 DIAGNOSIS — Z882 Allergy status to sulfonamides status: Secondary | ICD-10-CM | POA: Diagnosis not present

## 2020-10-12 DIAGNOSIS — N189 Chronic kidney disease, unspecified: Secondary | ICD-10-CM | POA: Diagnosis not present

## 2020-10-12 DIAGNOSIS — I129 Hypertensive chronic kidney disease with stage 1 through stage 4 chronic kidney disease, or unspecified chronic kidney disease: Secondary | ICD-10-CM | POA: Diagnosis not present

## 2020-10-12 DIAGNOSIS — M7989 Other specified soft tissue disorders: Secondary | ICD-10-CM | POA: Diagnosis not present

## 2020-10-12 DIAGNOSIS — E1165 Type 2 diabetes mellitus with hyperglycemia: Secondary | ICD-10-CM | POA: Diagnosis not present

## 2020-10-12 DIAGNOSIS — R52 Pain, unspecified: Secondary | ICD-10-CM | POA: Diagnosis not present

## 2020-10-12 DIAGNOSIS — Z6826 Body mass index (BMI) 26.0-26.9, adult: Secondary | ICD-10-CM | POA: Diagnosis not present

## 2020-10-12 DIAGNOSIS — Z880 Allergy status to penicillin: Secondary | ICD-10-CM | POA: Diagnosis not present

## 2020-10-12 DIAGNOSIS — M79605 Pain in left leg: Secondary | ICD-10-CM | POA: Diagnosis not present

## 2020-10-12 DIAGNOSIS — E7800 Pure hypercholesterolemia, unspecified: Secondary | ICD-10-CM | POA: Diagnosis not present

## 2020-10-12 DIAGNOSIS — S81802A Unspecified open wound, left lower leg, initial encounter: Secondary | ICD-10-CM | POA: Diagnosis not present

## 2020-10-12 DIAGNOSIS — M79604 Pain in right leg: Secondary | ICD-10-CM | POA: Diagnosis not present

## 2020-10-12 DIAGNOSIS — L509 Urticaria, unspecified: Secondary | ICD-10-CM | POA: Diagnosis not present

## 2020-10-12 DIAGNOSIS — Z79899 Other long term (current) drug therapy: Secondary | ICD-10-CM | POA: Diagnosis not present

## 2020-10-12 DIAGNOSIS — Z299 Encounter for prophylactic measures, unspecified: Secondary | ICD-10-CM | POA: Diagnosis not present

## 2020-10-12 DIAGNOSIS — E1122 Type 2 diabetes mellitus with diabetic chronic kidney disease: Secondary | ICD-10-CM | POA: Diagnosis not present

## 2020-10-14 DIAGNOSIS — I10 Essential (primary) hypertension: Secondary | ICD-10-CM | POA: Diagnosis not present

## 2020-10-14 DIAGNOSIS — E7800 Pure hypercholesterolemia, unspecified: Secondary | ICD-10-CM | POA: Diagnosis not present

## 2020-10-14 DIAGNOSIS — E119 Type 2 diabetes mellitus without complications: Secondary | ICD-10-CM | POA: Diagnosis not present

## 2020-10-17 DIAGNOSIS — Z880 Allergy status to penicillin: Secondary | ICD-10-CM | POA: Diagnosis not present

## 2020-10-17 DIAGNOSIS — R600 Localized edema: Secondary | ICD-10-CM | POA: Diagnosis not present

## 2020-10-17 DIAGNOSIS — Z882 Allergy status to sulfonamides status: Secondary | ICD-10-CM | POA: Diagnosis not present

## 2020-10-17 DIAGNOSIS — E1122 Type 2 diabetes mellitus with diabetic chronic kidney disease: Secondary | ICD-10-CM | POA: Diagnosis not present

## 2020-10-17 DIAGNOSIS — N189 Chronic kidney disease, unspecified: Secondary | ICD-10-CM | POA: Diagnosis not present

## 2020-10-17 DIAGNOSIS — I129 Hypertensive chronic kidney disease with stage 1 through stage 4 chronic kidney disease, or unspecified chronic kidney disease: Secondary | ICD-10-CM | POA: Diagnosis not present

## 2020-10-20 DIAGNOSIS — Z880 Allergy status to penicillin: Secondary | ICD-10-CM | POA: Diagnosis not present

## 2020-10-20 DIAGNOSIS — E1122 Type 2 diabetes mellitus with diabetic chronic kidney disease: Secondary | ICD-10-CM | POA: Diagnosis not present

## 2020-10-20 DIAGNOSIS — I129 Hypertensive chronic kidney disease with stage 1 through stage 4 chronic kidney disease, or unspecified chronic kidney disease: Secondary | ICD-10-CM | POA: Diagnosis not present

## 2020-10-20 DIAGNOSIS — R600 Localized edema: Secondary | ICD-10-CM | POA: Diagnosis not present

## 2020-10-20 DIAGNOSIS — Z882 Allergy status to sulfonamides status: Secondary | ICD-10-CM | POA: Diagnosis not present

## 2020-10-20 DIAGNOSIS — N189 Chronic kidney disease, unspecified: Secondary | ICD-10-CM | POA: Diagnosis not present

## 2020-10-24 DIAGNOSIS — Z880 Allergy status to penicillin: Secondary | ICD-10-CM | POA: Diagnosis not present

## 2020-10-24 DIAGNOSIS — D485 Neoplasm of uncertain behavior of skin: Secondary | ICD-10-CM | POA: Diagnosis not present

## 2020-10-24 DIAGNOSIS — L859 Epidermal thickening, unspecified: Secondary | ICD-10-CM | POA: Diagnosis not present

## 2020-10-24 DIAGNOSIS — R600 Localized edema: Secondary | ICD-10-CM | POA: Diagnosis not present

## 2020-10-24 DIAGNOSIS — N189 Chronic kidney disease, unspecified: Secondary | ICD-10-CM | POA: Diagnosis not present

## 2020-10-24 DIAGNOSIS — L280 Lichen simplex chronicus: Secondary | ICD-10-CM | POA: Diagnosis not present

## 2020-10-24 DIAGNOSIS — E1122 Type 2 diabetes mellitus with diabetic chronic kidney disease: Secondary | ICD-10-CM | POA: Diagnosis not present

## 2020-10-24 DIAGNOSIS — Z882 Allergy status to sulfonamides status: Secondary | ICD-10-CM | POA: Diagnosis not present

## 2020-10-24 DIAGNOSIS — B86 Scabies: Secondary | ICD-10-CM | POA: Diagnosis not present

## 2020-10-24 DIAGNOSIS — I129 Hypertensive chronic kidney disease with stage 1 through stage 4 chronic kidney disease, or unspecified chronic kidney disease: Secondary | ICD-10-CM | POA: Diagnosis not present

## 2020-10-27 DIAGNOSIS — R600 Localized edema: Secondary | ICD-10-CM | POA: Diagnosis not present

## 2020-10-27 DIAGNOSIS — Z880 Allergy status to penicillin: Secondary | ICD-10-CM | POA: Diagnosis not present

## 2020-10-27 DIAGNOSIS — Z882 Allergy status to sulfonamides status: Secondary | ICD-10-CM | POA: Diagnosis not present

## 2020-10-27 DIAGNOSIS — N189 Chronic kidney disease, unspecified: Secondary | ICD-10-CM | POA: Diagnosis not present

## 2020-10-27 DIAGNOSIS — E1122 Type 2 diabetes mellitus with diabetic chronic kidney disease: Secondary | ICD-10-CM | POA: Diagnosis not present

## 2020-10-27 DIAGNOSIS — I129 Hypertensive chronic kidney disease with stage 1 through stage 4 chronic kidney disease, or unspecified chronic kidney disease: Secondary | ICD-10-CM | POA: Diagnosis not present

## 2020-10-31 DIAGNOSIS — I129 Hypertensive chronic kidney disease with stage 1 through stage 4 chronic kidney disease, or unspecified chronic kidney disease: Secondary | ICD-10-CM | POA: Diagnosis not present

## 2020-10-31 DIAGNOSIS — Z882 Allergy status to sulfonamides status: Secondary | ICD-10-CM | POA: Diagnosis not present

## 2020-10-31 DIAGNOSIS — Z880 Allergy status to penicillin: Secondary | ICD-10-CM | POA: Diagnosis not present

## 2020-10-31 DIAGNOSIS — R600 Localized edema: Secondary | ICD-10-CM | POA: Diagnosis not present

## 2020-10-31 DIAGNOSIS — E1122 Type 2 diabetes mellitus with diabetic chronic kidney disease: Secondary | ICD-10-CM | POA: Diagnosis not present

## 2020-10-31 DIAGNOSIS — N189 Chronic kidney disease, unspecified: Secondary | ICD-10-CM | POA: Diagnosis not present

## 2020-11-02 DIAGNOSIS — R609 Edema, unspecified: Secondary | ICD-10-CM | POA: Diagnosis not present

## 2020-11-02 DIAGNOSIS — R52 Pain, unspecified: Secondary | ICD-10-CM | POA: Diagnosis not present

## 2020-11-02 DIAGNOSIS — S81802A Unspecified open wound, left lower leg, initial encounter: Secondary | ICD-10-CM | POA: Diagnosis not present

## 2020-11-03 DIAGNOSIS — E1122 Type 2 diabetes mellitus with diabetic chronic kidney disease: Secondary | ICD-10-CM | POA: Diagnosis not present

## 2020-11-03 DIAGNOSIS — Z880 Allergy status to penicillin: Secondary | ICD-10-CM | POA: Diagnosis not present

## 2020-11-03 DIAGNOSIS — R600 Localized edema: Secondary | ICD-10-CM | POA: Diagnosis not present

## 2020-11-03 DIAGNOSIS — I129 Hypertensive chronic kidney disease with stage 1 through stage 4 chronic kidney disease, or unspecified chronic kidney disease: Secondary | ICD-10-CM | POA: Diagnosis not present

## 2020-11-03 DIAGNOSIS — N189 Chronic kidney disease, unspecified: Secondary | ICD-10-CM | POA: Diagnosis not present

## 2020-11-03 DIAGNOSIS — Z882 Allergy status to sulfonamides status: Secondary | ICD-10-CM | POA: Diagnosis not present

## 2020-11-07 DIAGNOSIS — R600 Localized edema: Secondary | ICD-10-CM | POA: Diagnosis not present

## 2020-11-07 DIAGNOSIS — Z882 Allergy status to sulfonamides status: Secondary | ICD-10-CM | POA: Diagnosis not present

## 2020-11-07 DIAGNOSIS — E1122 Type 2 diabetes mellitus with diabetic chronic kidney disease: Secondary | ICD-10-CM | POA: Diagnosis not present

## 2020-11-07 DIAGNOSIS — Z880 Allergy status to penicillin: Secondary | ICD-10-CM | POA: Diagnosis not present

## 2020-11-07 DIAGNOSIS — I129 Hypertensive chronic kidney disease with stage 1 through stage 4 chronic kidney disease, or unspecified chronic kidney disease: Secondary | ICD-10-CM | POA: Diagnosis not present

## 2020-11-07 DIAGNOSIS — N189 Chronic kidney disease, unspecified: Secondary | ICD-10-CM | POA: Diagnosis not present

## 2020-11-08 DIAGNOSIS — L309 Dermatitis, unspecified: Secondary | ICD-10-CM | POA: Diagnosis not present

## 2020-11-08 DIAGNOSIS — L299 Pruritus, unspecified: Secondary | ICD-10-CM | POA: Diagnosis not present

## 2020-11-09 DIAGNOSIS — Z880 Allergy status to penicillin: Secondary | ICD-10-CM | POA: Diagnosis not present

## 2020-11-09 DIAGNOSIS — E1122 Type 2 diabetes mellitus with diabetic chronic kidney disease: Secondary | ICD-10-CM | POA: Diagnosis not present

## 2020-11-09 DIAGNOSIS — Z882 Allergy status to sulfonamides status: Secondary | ICD-10-CM | POA: Diagnosis not present

## 2020-11-09 DIAGNOSIS — N189 Chronic kidney disease, unspecified: Secondary | ICD-10-CM | POA: Diagnosis not present

## 2020-11-09 DIAGNOSIS — R600 Localized edema: Secondary | ICD-10-CM | POA: Diagnosis not present

## 2020-11-09 DIAGNOSIS — I129 Hypertensive chronic kidney disease with stage 1 through stage 4 chronic kidney disease, or unspecified chronic kidney disease: Secondary | ICD-10-CM | POA: Diagnosis not present

## 2020-11-14 DIAGNOSIS — E785 Hyperlipidemia, unspecified: Secondary | ICD-10-CM | POA: Diagnosis not present

## 2020-11-14 DIAGNOSIS — I890 Lymphedema, not elsewhere classified: Secondary | ICD-10-CM | POA: Diagnosis not present

## 2020-11-14 DIAGNOSIS — E1122 Type 2 diabetes mellitus with diabetic chronic kidney disease: Secondary | ICD-10-CM | POA: Diagnosis not present

## 2020-11-14 DIAGNOSIS — I10 Essential (primary) hypertension: Secondary | ICD-10-CM | POA: Diagnosis not present

## 2020-11-14 DIAGNOSIS — E7800 Pure hypercholesterolemia, unspecified: Secondary | ICD-10-CM | POA: Diagnosis not present

## 2020-11-14 DIAGNOSIS — Z880 Allergy status to penicillin: Secondary | ICD-10-CM | POA: Diagnosis not present

## 2020-11-14 DIAGNOSIS — Z299 Encounter for prophylactic measures, unspecified: Secondary | ICD-10-CM | POA: Diagnosis not present

## 2020-11-14 DIAGNOSIS — Z6827 Body mass index (BMI) 27.0-27.9, adult: Secondary | ICD-10-CM | POA: Diagnosis not present

## 2020-11-14 DIAGNOSIS — Z882 Allergy status to sulfonamides status: Secondary | ICD-10-CM | POA: Diagnosis not present

## 2020-11-14 DIAGNOSIS — I129 Hypertensive chronic kidney disease with stage 1 through stage 4 chronic kidney disease, or unspecified chronic kidney disease: Secondary | ICD-10-CM | POA: Diagnosis not present

## 2020-11-14 DIAGNOSIS — Z79899 Other long term (current) drug therapy: Secondary | ICD-10-CM | POA: Diagnosis not present

## 2020-11-14 DIAGNOSIS — N189 Chronic kidney disease, unspecified: Secondary | ICD-10-CM | POA: Diagnosis not present

## 2020-11-14 DIAGNOSIS — E1165 Type 2 diabetes mellitus with hyperglycemia: Secondary | ICD-10-CM | POA: Diagnosis not present

## 2020-11-15 DIAGNOSIS — I10 Essential (primary) hypertension: Secondary | ICD-10-CM | POA: Diagnosis not present

## 2020-11-15 DIAGNOSIS — E119 Type 2 diabetes mellitus without complications: Secondary | ICD-10-CM | POA: Diagnosis not present

## 2020-11-15 DIAGNOSIS — E7800 Pure hypercholesterolemia, unspecified: Secondary | ICD-10-CM | POA: Diagnosis not present

## 2020-11-16 ENCOUNTER — Other Ambulatory Visit: Payer: Self-pay

## 2020-11-16 DIAGNOSIS — R600 Localized edema: Secondary | ICD-10-CM | POA: Diagnosis not present

## 2020-11-16 DIAGNOSIS — S81802D Unspecified open wound, left lower leg, subsequent encounter: Secondary | ICD-10-CM

## 2020-11-16 DIAGNOSIS — S81802A Unspecified open wound, left lower leg, initial encounter: Secondary | ICD-10-CM

## 2020-11-18 DIAGNOSIS — E785 Hyperlipidemia, unspecified: Secondary | ICD-10-CM | POA: Diagnosis not present

## 2020-11-18 DIAGNOSIS — E1122 Type 2 diabetes mellitus with diabetic chronic kidney disease: Secondary | ICD-10-CM | POA: Diagnosis not present

## 2020-11-18 DIAGNOSIS — Z79899 Other long term (current) drug therapy: Secondary | ICD-10-CM | POA: Diagnosis not present

## 2020-11-18 DIAGNOSIS — I129 Hypertensive chronic kidney disease with stage 1 through stage 4 chronic kidney disease, or unspecified chronic kidney disease: Secondary | ICD-10-CM | POA: Diagnosis not present

## 2020-11-18 DIAGNOSIS — I890 Lymphedema, not elsewhere classified: Secondary | ICD-10-CM | POA: Diagnosis not present

## 2020-11-18 DIAGNOSIS — N189 Chronic kidney disease, unspecified: Secondary | ICD-10-CM | POA: Diagnosis not present

## 2020-11-21 DIAGNOSIS — E785 Hyperlipidemia, unspecified: Secondary | ICD-10-CM | POA: Diagnosis not present

## 2020-11-21 DIAGNOSIS — Z79899 Other long term (current) drug therapy: Secondary | ICD-10-CM | POA: Diagnosis not present

## 2020-11-21 DIAGNOSIS — I129 Hypertensive chronic kidney disease with stage 1 through stage 4 chronic kidney disease, or unspecified chronic kidney disease: Secondary | ICD-10-CM | POA: Diagnosis not present

## 2020-11-21 DIAGNOSIS — E1122 Type 2 diabetes mellitus with diabetic chronic kidney disease: Secondary | ICD-10-CM | POA: Diagnosis not present

## 2020-11-21 DIAGNOSIS — I890 Lymphedema, not elsewhere classified: Secondary | ICD-10-CM | POA: Diagnosis not present

## 2020-11-21 DIAGNOSIS — L299 Pruritus, unspecified: Secondary | ICD-10-CM | POA: Diagnosis not present

## 2020-11-21 DIAGNOSIS — N189 Chronic kidney disease, unspecified: Secondary | ICD-10-CM | POA: Diagnosis not present

## 2020-11-24 DIAGNOSIS — N189 Chronic kidney disease, unspecified: Secondary | ICD-10-CM | POA: Diagnosis not present

## 2020-11-24 DIAGNOSIS — I890 Lymphedema, not elsewhere classified: Secondary | ICD-10-CM | POA: Diagnosis not present

## 2020-11-24 DIAGNOSIS — E1122 Type 2 diabetes mellitus with diabetic chronic kidney disease: Secondary | ICD-10-CM | POA: Diagnosis not present

## 2020-11-24 DIAGNOSIS — E785 Hyperlipidemia, unspecified: Secondary | ICD-10-CM | POA: Diagnosis not present

## 2020-11-24 DIAGNOSIS — Z79899 Other long term (current) drug therapy: Secondary | ICD-10-CM | POA: Diagnosis not present

## 2020-11-24 DIAGNOSIS — I129 Hypertensive chronic kidney disease with stage 1 through stage 4 chronic kidney disease, or unspecified chronic kidney disease: Secondary | ICD-10-CM | POA: Diagnosis not present

## 2020-11-28 DIAGNOSIS — I890 Lymphedema, not elsewhere classified: Secondary | ICD-10-CM | POA: Diagnosis not present

## 2020-11-28 DIAGNOSIS — I129 Hypertensive chronic kidney disease with stage 1 through stage 4 chronic kidney disease, or unspecified chronic kidney disease: Secondary | ICD-10-CM | POA: Diagnosis not present

## 2020-11-28 DIAGNOSIS — Z79899 Other long term (current) drug therapy: Secondary | ICD-10-CM | POA: Diagnosis not present

## 2020-11-28 DIAGNOSIS — N189 Chronic kidney disease, unspecified: Secondary | ICD-10-CM | POA: Diagnosis not present

## 2020-11-28 DIAGNOSIS — E1122 Type 2 diabetes mellitus with diabetic chronic kidney disease: Secondary | ICD-10-CM | POA: Diagnosis not present

## 2020-11-28 DIAGNOSIS — E785 Hyperlipidemia, unspecified: Secondary | ICD-10-CM | POA: Diagnosis not present

## 2020-12-01 DIAGNOSIS — I129 Hypertensive chronic kidney disease with stage 1 through stage 4 chronic kidney disease, or unspecified chronic kidney disease: Secondary | ICD-10-CM | POA: Diagnosis not present

## 2020-12-01 DIAGNOSIS — N189 Chronic kidney disease, unspecified: Secondary | ICD-10-CM | POA: Diagnosis not present

## 2020-12-01 DIAGNOSIS — I890 Lymphedema, not elsewhere classified: Secondary | ICD-10-CM | POA: Diagnosis not present

## 2020-12-01 DIAGNOSIS — E1122 Type 2 diabetes mellitus with diabetic chronic kidney disease: Secondary | ICD-10-CM | POA: Diagnosis not present

## 2020-12-01 DIAGNOSIS — Z79899 Other long term (current) drug therapy: Secondary | ICD-10-CM | POA: Diagnosis not present

## 2020-12-01 DIAGNOSIS — E785 Hyperlipidemia, unspecified: Secondary | ICD-10-CM | POA: Diagnosis not present

## 2020-12-02 DIAGNOSIS — E2839 Other primary ovarian failure: Secondary | ICD-10-CM | POA: Diagnosis not present

## 2020-12-05 ENCOUNTER — Encounter: Payer: Medicare Other | Admitting: Vascular Surgery

## 2020-12-05 DIAGNOSIS — E785 Hyperlipidemia, unspecified: Secondary | ICD-10-CM | POA: Diagnosis not present

## 2020-12-05 DIAGNOSIS — E1122 Type 2 diabetes mellitus with diabetic chronic kidney disease: Secondary | ICD-10-CM | POA: Diagnosis not present

## 2020-12-05 DIAGNOSIS — I129 Hypertensive chronic kidney disease with stage 1 through stage 4 chronic kidney disease, or unspecified chronic kidney disease: Secondary | ICD-10-CM | POA: Diagnosis not present

## 2020-12-05 DIAGNOSIS — I890 Lymphedema, not elsewhere classified: Secondary | ICD-10-CM | POA: Diagnosis not present

## 2020-12-05 DIAGNOSIS — Z79899 Other long term (current) drug therapy: Secondary | ICD-10-CM | POA: Diagnosis not present

## 2020-12-05 DIAGNOSIS — N189 Chronic kidney disease, unspecified: Secondary | ICD-10-CM | POA: Diagnosis not present

## 2020-12-07 DIAGNOSIS — S81802S Unspecified open wound, left lower leg, sequela: Secondary | ICD-10-CM | POA: Diagnosis not present

## 2020-12-07 DIAGNOSIS — I890 Lymphedema, not elsewhere classified: Secondary | ICD-10-CM | POA: Diagnosis not present

## 2020-12-15 DIAGNOSIS — E119 Type 2 diabetes mellitus without complications: Secondary | ICD-10-CM | POA: Diagnosis not present

## 2020-12-15 DIAGNOSIS — E7800 Pure hypercholesterolemia, unspecified: Secondary | ICD-10-CM | POA: Diagnosis not present

## 2020-12-15 DIAGNOSIS — I10 Essential (primary) hypertension: Secondary | ICD-10-CM | POA: Diagnosis not present

## 2020-12-22 DIAGNOSIS — L308 Other specified dermatitis: Secondary | ICD-10-CM | POA: Diagnosis not present

## 2021-01-12 DIAGNOSIS — F408 Other phobic anxiety disorders: Secondary | ICD-10-CM | POA: Diagnosis not present

## 2021-01-12 DIAGNOSIS — L308 Other specified dermatitis: Secondary | ICD-10-CM | POA: Diagnosis not present

## 2021-01-24 DIAGNOSIS — Z299 Encounter for prophylactic measures, unspecified: Secondary | ICD-10-CM | POA: Diagnosis not present

## 2021-01-24 DIAGNOSIS — Z6827 Body mass index (BMI) 27.0-27.9, adult: Secondary | ICD-10-CM | POA: Diagnosis not present

## 2021-01-24 DIAGNOSIS — N183 Chronic kidney disease, stage 3 unspecified: Secondary | ICD-10-CM | POA: Diagnosis not present

## 2021-01-24 DIAGNOSIS — I10 Essential (primary) hypertension: Secondary | ICD-10-CM | POA: Diagnosis not present

## 2021-01-24 DIAGNOSIS — Z789 Other specified health status: Secondary | ICD-10-CM | POA: Diagnosis not present

## 2021-01-24 DIAGNOSIS — E1165 Type 2 diabetes mellitus with hyperglycemia: Secondary | ICD-10-CM | POA: Diagnosis not present

## 2021-01-24 DIAGNOSIS — E1122 Type 2 diabetes mellitus with diabetic chronic kidney disease: Secondary | ICD-10-CM | POA: Diagnosis not present

## 2021-01-26 DIAGNOSIS — L308 Other specified dermatitis: Secondary | ICD-10-CM | POA: Diagnosis not present

## 2021-01-31 DIAGNOSIS — Z299 Encounter for prophylactic measures, unspecified: Secondary | ICD-10-CM | POA: Diagnosis not present

## 2021-01-31 DIAGNOSIS — E1165 Type 2 diabetes mellitus with hyperglycemia: Secondary | ICD-10-CM | POA: Diagnosis not present

## 2021-01-31 DIAGNOSIS — N183 Chronic kidney disease, stage 3 unspecified: Secondary | ICD-10-CM | POA: Diagnosis not present

## 2021-01-31 DIAGNOSIS — E1122 Type 2 diabetes mellitus with diabetic chronic kidney disease: Secondary | ICD-10-CM | POA: Diagnosis not present

## 2021-01-31 DIAGNOSIS — I10 Essential (primary) hypertension: Secondary | ICD-10-CM | POA: Diagnosis not present

## 2021-03-03 DIAGNOSIS — E1165 Type 2 diabetes mellitus with hyperglycemia: Secondary | ICD-10-CM | POA: Diagnosis not present

## 2021-03-03 DIAGNOSIS — Z299 Encounter for prophylactic measures, unspecified: Secondary | ICD-10-CM | POA: Diagnosis not present

## 2021-03-03 DIAGNOSIS — I10 Essential (primary) hypertension: Secondary | ICD-10-CM | POA: Diagnosis not present

## 2021-03-03 DIAGNOSIS — E7800 Pure hypercholesterolemia, unspecified: Secondary | ICD-10-CM | POA: Diagnosis not present

## 2021-03-28 DIAGNOSIS — Z299 Encounter for prophylactic measures, unspecified: Secondary | ICD-10-CM | POA: Diagnosis not present

## 2021-03-28 DIAGNOSIS — E1122 Type 2 diabetes mellitus with diabetic chronic kidney disease: Secondary | ICD-10-CM | POA: Diagnosis not present

## 2021-03-28 DIAGNOSIS — L509 Urticaria, unspecified: Secondary | ICD-10-CM | POA: Diagnosis not present

## 2021-03-28 DIAGNOSIS — N183 Chronic kidney disease, stage 3 unspecified: Secondary | ICD-10-CM | POA: Diagnosis not present

## 2021-03-28 DIAGNOSIS — I10 Essential (primary) hypertension: Secondary | ICD-10-CM | POA: Diagnosis not present

## 2021-04-17 ENCOUNTER — Emergency Department (HOSPITAL_COMMUNITY): Payer: Medicare Other

## 2021-04-17 ENCOUNTER — Other Ambulatory Visit: Payer: Self-pay

## 2021-04-17 ENCOUNTER — Encounter (HOSPITAL_COMMUNITY): Payer: Self-pay | Admitting: Emergency Medicine

## 2021-04-17 ENCOUNTER — Inpatient Hospital Stay (HOSPITAL_COMMUNITY)
Admission: EM | Admit: 2021-04-17 | Discharge: 2021-04-20 | DRG: 522 | Disposition: A | Discharge status: Skilled Nursing Facility | Payer: Medicare Other | Attending: Internal Medicine | Admitting: Internal Medicine

## 2021-04-17 DIAGNOSIS — W19XXXA Unspecified fall, initial encounter: Secondary | ICD-10-CM | POA: Diagnosis not present

## 2021-04-17 DIAGNOSIS — R9431 Abnormal electrocardiogram [ECG] [EKG]: Secondary | ICD-10-CM | POA: Diagnosis not present

## 2021-04-17 DIAGNOSIS — M7989 Other specified soft tissue disorders: Secondary | ICD-10-CM | POA: Diagnosis not present

## 2021-04-17 DIAGNOSIS — Z96642 Presence of left artificial hip joint: Secondary | ICD-10-CM | POA: Diagnosis not present

## 2021-04-17 DIAGNOSIS — E785 Hyperlipidemia, unspecified: Secondary | ICD-10-CM | POA: Diagnosis not present

## 2021-04-17 DIAGNOSIS — S72002D Fracture of unspecified part of neck of left femur, subsequent encounter for closed fracture with routine healing: Secondary | ICD-10-CM | POA: Diagnosis not present

## 2021-04-17 DIAGNOSIS — R0902 Hypoxemia: Secondary | ICD-10-CM | POA: Diagnosis not present

## 2021-04-17 DIAGNOSIS — M6281 Muscle weakness (generalized): Secondary | ICD-10-CM | POA: Diagnosis not present

## 2021-04-17 DIAGNOSIS — S72032A Displaced midcervical fracture of left femur, initial encounter for closed fracture: Secondary | ICD-10-CM | POA: Diagnosis not present

## 2021-04-17 DIAGNOSIS — M25552 Pain in left hip: Secondary | ICD-10-CM | POA: Diagnosis not present

## 2021-04-17 DIAGNOSIS — R1111 Vomiting without nausea: Secondary | ICD-10-CM | POA: Diagnosis not present

## 2021-04-17 DIAGNOSIS — Y92009 Unspecified place in unspecified non-institutional (private) residence as the place of occurrence of the external cause: Secondary | ICD-10-CM

## 2021-04-17 DIAGNOSIS — Z781 Physical restraint status: Secondary | ICD-10-CM | POA: Diagnosis not present

## 2021-04-17 DIAGNOSIS — R410 Disorientation, unspecified: Secondary | ICD-10-CM | POA: Diagnosis not present

## 2021-04-17 DIAGNOSIS — I10 Essential (primary) hypertension: Secondary | ICD-10-CM | POA: Diagnosis present

## 2021-04-17 DIAGNOSIS — S72002A Fracture of unspecified part of neck of left femur, initial encounter for closed fracture: Secondary | ICD-10-CM | POA: Diagnosis not present

## 2021-04-17 DIAGNOSIS — M1612 Unilateral primary osteoarthritis, left hip: Secondary | ICD-10-CM | POA: Diagnosis not present

## 2021-04-17 DIAGNOSIS — Z20822 Contact with and (suspected) exposure to covid-19: Secondary | ICD-10-CM | POA: Diagnosis present

## 2021-04-17 DIAGNOSIS — R110 Nausea: Secondary | ICD-10-CM | POA: Diagnosis not present

## 2021-04-17 DIAGNOSIS — W010XXA Fall on same level from slipping, tripping and stumbling without subsequent striking against object, initial encounter: Secondary | ICD-10-CM | POA: Diagnosis not present

## 2021-04-17 DIAGNOSIS — Z9889 Other specified postprocedural states: Secondary | ICD-10-CM | POA: Diagnosis not present

## 2021-04-17 DIAGNOSIS — R600 Localized edema: Secondary | ICD-10-CM | POA: Diagnosis not present

## 2021-04-17 DIAGNOSIS — Z471 Aftercare following joint replacement surgery: Secondary | ICD-10-CM | POA: Diagnosis not present

## 2021-04-17 DIAGNOSIS — E876 Hypokalemia: Secondary | ICD-10-CM | POA: Diagnosis present

## 2021-04-17 DIAGNOSIS — R262 Difficulty in walking, not elsewhere classified: Secondary | ICD-10-CM | POA: Diagnosis not present

## 2021-04-17 DIAGNOSIS — Z882 Allergy status to sulfonamides status: Secondary | ICD-10-CM | POA: Diagnosis not present

## 2021-04-17 DIAGNOSIS — Z9181 History of falling: Secondary | ICD-10-CM | POA: Diagnosis not present

## 2021-04-17 DIAGNOSIS — Z961 Presence of intraocular lens: Secondary | ICD-10-CM | POA: Diagnosis not present

## 2021-04-17 LAB — CBC WITH DIFFERENTIAL/PLATELET
Abs Immature Granulocytes: 0.23 10*3/uL — ABNORMAL HIGH (ref 0.00–0.07)
Basophils Absolute: 0.1 10*3/uL (ref 0.0–0.1)
Basophils Relative: 1 %
Eosinophils Absolute: 0.1 10*3/uL (ref 0.0–0.5)
Eosinophils Relative: 1 %
HCT: 37 % (ref 36.0–46.0)
Hemoglobin: 12.9 g/dL (ref 12.0–15.0)
Immature Granulocytes: 2 %
Lymphocytes Relative: 9 %
Lymphs Abs: 1.4 10*3/uL (ref 0.7–4.0)
MCH: 32.6 pg (ref 26.0–34.0)
MCHC: 34.9 g/dL (ref 30.0–36.0)
MCV: 93.4 fL (ref 80.0–100.0)
Monocytes Absolute: 1 10*3/uL (ref 0.1–1.0)
Monocytes Relative: 6 %
Neutro Abs: 12.7 10*3/uL — ABNORMAL HIGH (ref 1.7–7.7)
Neutrophils Relative %: 81 %
Platelets: 249 10*3/uL (ref 150–400)
RBC: 3.96 MIL/uL (ref 3.87–5.11)
RDW: 13.8 % (ref 11.5–15.5)
WBC: 15.5 10*3/uL — ABNORMAL HIGH (ref 4.0–10.5)
nRBC: 0 % (ref 0.0–0.2)

## 2021-04-17 LAB — COMPREHENSIVE METABOLIC PANEL
ALT: 23 U/L (ref 0–44)
AST: 26 U/L (ref 15–41)
Albumin: 3.6 g/dL (ref 3.5–5.0)
Alkaline Phosphatase: 47 U/L (ref 38–126)
Anion gap: 9 (ref 5–15)
BUN: 24 mg/dL — ABNORMAL HIGH (ref 8–23)
CO2: 24 mmol/L (ref 22–32)
Calcium: 8.8 mg/dL — ABNORMAL LOW (ref 8.9–10.3)
Chloride: 104 mmol/L (ref 98–111)
Creatinine, Ser: 0.95 mg/dL (ref 0.44–1.00)
GFR, Estimated: 57 mL/min — ABNORMAL LOW (ref >60)
Glucose, Bld: 223 mg/dL — ABNORMAL HIGH (ref 70–99)
Potassium: 3.2 mmol/L — ABNORMAL LOW (ref 3.5–5.1)
Sodium: 137 mmol/L (ref 135–145)
Total Bilirubin: 0.7 mg/dL (ref 0.3–1.2)
Total Protein: 6.5 g/dL (ref 6.5–8.1)

## 2021-04-17 LAB — RESP PANEL BY RT-PCR (FLU A&B, COVID) ARPGX2
Influenza A by PCR: NEGATIVE
Influenza B by PCR: NEGATIVE
SARS Coronavirus 2 by RT PCR: NEGATIVE

## 2021-04-17 LAB — PROTIME-INR
INR: 0.9 (ref 0.8–1.2)
Prothrombin Time: 12.2 seconds (ref 11.4–15.2)

## 2021-04-17 MED ORDER — POTASSIUM CHLORIDE 20 MEQ PO PACK
40.0000 meq | PACK | Freq: Once | ORAL | Status: AC
  Administered 2021-04-17: 40 meq via ORAL
  Filled 2021-04-17: qty 2

## 2021-04-17 MED ORDER — ACETAMINOPHEN 325 MG PO TABS: 650.0000 mg | ORAL_TABLET | Freq: Four times a day (QID) | ORAL | Status: DC | PRN

## 2021-04-17 MED ORDER — MORPHINE SULFATE (PF) 4 MG/ML IV SOLN
4.0000 mg | INTRAVENOUS | Status: DC | PRN
  Administered 2021-04-18 (×2): 4 mg via INTRAVENOUS
  Administered 2021-04-20: 2 mg via INTRAVENOUS
  Filled 2021-04-17 (×3): qty 1

## 2021-04-17 MED ORDER — SODIUM CHLORIDE 0.9 % IV BOLUS
500.0000 mL | Freq: Once | INTRAVENOUS | Status: AC
  Administered 2021-04-17: 500 mL via INTRAVENOUS

## 2021-04-17 MED ORDER — TRANEXAMIC ACID-NACL 1000-0.7 MG/100ML-% IV SOLN
1000.0000 mg | INTRAVENOUS | Status: AC
  Administered 2021-04-18: 1000 mg via INTRAVENOUS

## 2021-04-17 MED ORDER — LACTATED RINGERS IV SOLN: INTRAVENOUS | Status: AC

## 2021-04-17 MED ORDER — ONDANSETRON HCL 4 MG/2ML IJ SOLN: 4.0000 mg | Freq: Four times a day (QID) | INTRAMUSCULAR | Status: DC | PRN

## 2021-04-17 MED ORDER — ONDANSETRON HCL 4 MG PO TABS: 4.0000 mg | ORAL_TABLET | Freq: Four times a day (QID) | ORAL | Status: DC | PRN

## 2021-04-17 MED ORDER — CEFAZOLIN SODIUM-DEXTROSE 2-4 GM/100ML-% IV SOLN
2.0000 g | INTRAVENOUS | Status: AC
  Administered 2021-04-18: 2 g via INTRAVENOUS
  Filled 2021-04-17: qty 100

## 2021-04-17 MED ORDER — FENTANYL CITRATE (PF) 100 MCG/2ML IJ SOLN
50.0000 ug | Freq: Once | INTRAMUSCULAR | Status: AC
Start: 2021-04-17 — End: 2021-04-17
  Administered 2021-04-17: 50 ug via INTRAVENOUS
  Filled 2021-04-17: qty 2

## 2021-04-17 MED ORDER — POLYETHYLENE GLYCOL 3350 17 G PO PACK: 17.0000 g | PACK | Freq: Every day | ORAL | Status: DC | PRN

## 2021-04-17 MED ORDER — MORPHINE SULFATE (PF) 4 MG/ML IV SOLN
4.0000 mg | INTRAVENOUS | Status: DC | PRN
  Administered 2021-04-17 (×2): 4 mg via INTRAVENOUS
  Filled 2021-04-17 (×2): qty 1

## 2021-04-17 MED ORDER — ONDANSETRON HCL 4 MG/2ML IJ SOLN
4.0000 mg | Freq: Once | INTRAMUSCULAR | Status: AC
  Administered 2021-04-17: 4 mg via INTRAVENOUS
  Filled 2021-04-17: qty 2

## 2021-04-17 MED ORDER — ACETAMINOPHEN 650 MG RE SUPP: 650.0000 mg | Freq: Four times a day (QID) | RECTAL | Status: DC | PRN

## 2021-04-17 NOTE — ED Notes (Signed)
[phone] pt's niece Aretha Parrot, helped pt call family and update them.

## 2021-04-17 NOTE — ED Provider Notes (Signed)
Emergency Department Provider Note   I have reviewed the triage vital signs and the nursing notes.   HISTORY  Chief Complaint Hip Pain   HPI Dominique Farrell is a 87 y.o. female with past medical history reviewed below presents to the emergency department for evaluation of left hip pain after fall.  Patient was in her house and putting away groceries when she fell landing on the left hip.  She did not strike her head.  She is unable to walk or move the hip without severe pain.  She denies numbness in the left leg.  No arm or shoulder pain.  No chest pain, palpitations, shortness of breath prior to falling.  No prior surgery on the hips. She is not anticoagulated.   Past Medical History:  Diagnosis Date  . Hypercholesteremia   . Hypertension     Patient Active Problem List   Diagnosis Date Noted  . Closed left hip fracture ([place]) 04/17/2021    Past Surgical History:  Procedure Laterality Date  . CATARACT EXTRACTION W/PHACO Left 03/26/2016   Procedure: CATARACT EXTRACTION PHACO AND INTRAOCULAR LENS PLACEMENT (IOC);  Surgeon: Tonny Branch, MD;  Location: [facility];  Service: Ophthalmology;  Laterality: Left;  CDE 10.22  . CATARACT EXTRACTION W/PHACO Right 04/12/2016   Procedure: CATARACT EXTRACTION PHACO AND INTRAOCULAR LENS PLACEMENT (IOC);  Surgeon: Tonny Branch, MD;  Location: [facility];  Service: Ophthalmology;  Laterality: Right;  CDE 10.51    Allergies Sulfa antibiotics  History reviewed. No pertinent family history.  Social History Social History   Tobacco Use  . Smoking status: Never Smoker  . Smokeless tobacco: Never Used  Substance Use Topics  . Alcohol use: No  . Drug use: No    Review of Systems  Constitutional: No fever/chills Eyes: No visual changes. ENT: No sore throat. Cardiovascular: Denies chest pain. Respiratory: Denies shortness of breath. Gastrointestinal: No abdominal pain.  No nausea, no vomiting.  No diarrhea.  No constipation. Genitourinary: Negative  for dysuria. Musculoskeletal: Negative for back pain. Positive left hip pain.  Skin: Negative for rash. Neurological: Negative for headaches, focal weakness or numbness.  10-point ROS otherwise negative.  ____________________________________________   PHYSICAL EXAM:  VITAL SIGNS: Temp: 98.1 F Pulse: 84 RR: 18 BP: 148/73 SpO2: 95%  Constitutional: Alert and oriented. Well appearing and in no acute distress. Eyes: Conjunctivae are normal.  Head: Atraumatic. Nose: No congestion/rhinnorhea. Mouth/Throat: Mucous membranes are moist. Neck: No stridor.   Cardiovascular: Normal rate, regular rhythm. Good peripheral circulation. Grossly normal heart sounds.   Respiratory: Normal respiratory effort.  No retractions. Lungs CTAB. Gastrointestinal: Soft and nontender. No distention.  Musculoskeletal: Left leg is shortened and externally rotated.  Exquisite tenderness with any attempted range of motion.  No tenderness over the left ankle or knee. The left leg is neurovascularly intact.  Neurologic:  Normal speech and language. No gross focal neurologic deficits are appreciated.  Skin:  Skin is warm, dry and intact. No rash noted.   ____________________________________________   LABS (all labs ordered are listed, but only abnormal results are displayed)  Labs Reviewed  COMPREHENSIVE METABOLIC PANEL - Abnormal; Notable for the following components:      Result Value   Potassium 3.2 (*)    Glucose, Bld 223 (*)    BUN 24 (*)    Calcium 8.8 (*)    GFR, Estimated 57 (*)    All other components within normal limits  CBC WITH DIFFERENTIAL/PLATELET - Abnormal; Notable for the following components:  WBC 15.5 (*)    Neutro Abs 12.7 (*)    Abs Immature Granulocytes 0.23 (*)    All other components within normal limits  BASIC METABOLIC PANEL - Abnormal; Notable for the following components:   Glucose, Bld 131 (*)    Calcium 8.8 (*)    GFR, Estimated 60 (*)    All other components within  normal limits  RESP PANEL BY RT-PCR (FLU A&B, COVID) ARPGX2  SURGICAL PCR SCREEN  PROTIME-INR  URINALYSIS, ROUTINE W REFLEX MICROSCOPIC   ____________________________________________  EKG  EKG interpreted by me. Sinus rhythm. No ischemic change.  ____________________________________________  RADIOLOGY  DG Chest Portable 1 View  Result Date: 04/17/2021 CLINICAL DATA:  [place], left hip pain and fracture EXAM: PORTABLE CHEST 1 VIEW COMPARISON:  04/13/2020 FINDINGS: 2 frontal views of the chest demonstrate an unremarkable cardiac silhouette. Stable atherosclerosis of the aortic arch. No airspace disease, effusion, or pneumothorax. No acute bony abnormalities. IMPRESSION: 1. Stable chest, no acute process. Electronically Signed   By: Randa Ngo M.D.   On: 04/17/2021 17:12   DG Hip Unilat W or Wo Pelvis 2-3 Views Left  Result Date: 04/17/2021 CLINICAL DATA:  Pain status post fall EXAM: DG HIP (WITH OR WITHOUT PELVIS) 2-3V LEFT COMPARISON:  None. FINDINGS: There is acute displaced transcervical fracture of the proximal left femur. No dislocation. There are degenerative changes of the hips. There is diffuse osteopenia. IMPRESSION: Acute displaced fracture of the proximal left femur. Electronically Signed   By: Constance Holster M.D.   On: 04/17/2021 16:55    ____________________________________________   PROCEDURES  Procedure(s) performed:   Procedures  None ____________________________________________   INITIAL IMPRESSION / ASSESSMENT AND PLAN / ED COURSE  Pertinent labs & imaging results that were available during my care of the patient were reviewed by me and considered in my medical decision making (see chart for details).   Patient presents to the emergency department for evaluation of left leg pain after fall.  Clinically the hip appears fractured.  Plain films ordered.  Will need to rule out dislocation.  No prior history of hip replacement.  Have ordered preop labs and CXR.    Discussed with ortho regarding hip fracture. Will contact [facility] regarding operative timing. Patient to stay at [facility].   Discussed patient's case with [facility] to request admission. Patient and family (if present) updated with plan. Care transferred to [facility] service.  I reviewed all nursing notes, vitals, pertinent old records, EKGs, labs, imaging (as available).  ____________________________________________  FINAL CLINICAL IMPRESSION(S) / ED DIAGNOSES  Final diagnoses:  Closed fracture of neck of left femur, initial encounter ([place])     MEDICATIONS GIVEN DURING THIS VISIT:  Medications  ceFAZolin (ANCEF) IVPB 2g/100 mL premix (has no administration in time range)  tranexamic acid (CYKLOKAPRON) IVPB 1,000 mg (has no administration in time range)  acetaminophen (TYLENOL) tablet 650 mg (has no administration in time range)    Or  acetaminophen (TYLENOL) suppository 650 mg (has no administration in time range)  lactated ringers infusion ( Intravenous Infusion Verify 04/18/21 0656)  ondansetron (ZOFRAN) tablet 4 mg (has no administration in time range)    Or  ondansetron (ZOFRAN) injection 4 mg (has no administration in time range)  polyethylene glycol (MIRALAX / GLYCOLAX) packet 17 g (has no administration in time range)  morphine 4 MG/ML injection 4 mg (4 mg Intravenous Given 04/18/21 0905)  fentaNYL (SUBLIMAZE) injection 50 mcg (50 mcg Intravenous Given 04/17/21 1604)  ondansetron (ZOFRAN) injection 4 mg (  4 mg Intravenous Given 04/17/21 1602)  sodium chloride 0.9 % bolus 500 mL (0 mLs Intravenous Stopped 04/17/21 1731)  potassium chloride (KLOR-CON) packet 40 mEq (40 mEq Oral Given 04/17/21 2252)     Note:  This document was prepared using Dragon voice recognition software and may include unintentional dictation errors.  Nanda Quinton, MD, [facility] Emergency Medicine    Odies Desa, Wonda Olds, MD 04/18/21 [phone]

## 2021-04-17 NOTE — H&P (Signed)
History and Physical    Dominique Farrell UEA:[id number] DOB: [birth year] DOA: 04/17/2021  PCP: Monico Blitz, MD   Patient coming from: Home  I have personally briefly reviewed patient's old medical records in [place]  Chief Complaint: Fall, left hip pain  HPI: Dominique Farrell is a 87 y.o. female with medical history significant for hypertension, dyslipidemia. Patient presented to the ED with reports of a fall.  Patient reports she had just finished putting stress away, when she turned suddenly on her leg, lost her balance and fell on her left side.  She did not hit her head.  She did not lose consciousness. She denies frequent falls.  No cardiac or lung history.  ED Course: Stable vitals.  Acute displaced fracture of the proximal left femur.  Portable chest x-ray without acute abnormality.  ED provider talked to orthopedist Dr. Amedeo Kinsman, okay to admit here, will see patient in consult.  Review of Systems: As per HPI all other systems reviewed and negative.  Past Medical History:  Diagnosis Date  . Hypercholesteremia   . Hypertension     Past Surgical History:  Procedure Laterality Date  . CATARACT EXTRACTION W/PHACO Left 03/26/2016   Procedure: CATARACT EXTRACTION PHACO AND INTRAOCULAR LENS PLACEMENT (IOC);  Surgeon: Tonny Branch, MD;  Location: [facility];  Service: Ophthalmology;  Laterality: Left;  CDE 10.22  . CATARACT EXTRACTION W/PHACO Right 04/12/2016   Procedure: CATARACT EXTRACTION PHACO AND INTRAOCULAR LENS PLACEMENT (IOC);  Surgeon: Tonny Branch, MD;  Location: [facility];  Service: Ophthalmology;  Laterality: Right;  CDE 10.51     reports that she has never smoked. She has never used smokeless tobacco. She reports that she does not drink alcohol and does not use drugs.  Allergies  Allergen Reactions  . Sulfa Antibiotics Nausea Only     Prior to Admission medications   Medication Sig Start Date End Date Taking? Authorizing Provider  aspirin EC 81 MG tablet Take 81 mg by mouth  daily.    [provider]  atorvastatin (LIPITOR) 20 MG tablet Take 1 tablet by mouth daily. 02/23/16   [provider]  lisinopril-hydrochlorothiazide (PRINZIDE,ZESTORETIC) 20-25 MG tablet Take 1 tablet by mouth daily. 02/23/16   [provider]    Physical Exam: Vitals:   04/17/21 1600 04/17/21 1630 04/17/21 1700 04/17/21 1732  BP: (!) 150/61 (!) 153/74 (!) 158/72 (!) 147/73  Pulse: 88 91 89 92  Resp:    18  Temp:    98.1 F (36.7 C)  TempSrc:    Oral  SpO2: 96% 97% 92% 97%  Weight:      Height:        Constitutional: NAD, calm, comfortable Vitals:   04/17/21 1600 04/17/21 1630 04/17/21 1700 04/17/21 1732  BP: (!) 150/61 (!) 153/74 (!) 158/72 (!) 147/73  Pulse: 88 91 89 92  Resp:    18  Temp:    98.1 F (36.7 C)  TempSrc:    Oral  SpO2: 96% 97% 92% 97%  Weight:      Height:       Eyes: PERRL, lids and conjunctivae normal ENMT: Mucous membranes are moist.   Neck: normal, supple, no masses, no thyromegaly Respiratory: clear to auscultation bilaterally, no wheezing, no crackles. Normal respiratory effort. No accessory muscle use.  Cardiovascular: Regular rate and rhythm, no murmurs / rubs / gallops.  1+ pitting extremity edema right greater than left.  Chronic and Unchanged. Extremity edema. 2+ pedal pulses. Abdomen: no tenderness, no masses palpated.  No hepatosplenomegaly. Bowel sounds positive.  Musculoskeletal: no clubbing / cyanosis. No joint deformity upper and lower extremities. Good ROM, no contractures. Normal muscle tone.  Skin: no rashes, lesions, ulcers. No induration Neurologic: No apparent cranial abnormality, moving extremities spontaneously, left lower extremity not tested. Psychiatric: Normal judgment and insight. Alert and oriented x 3. Normal mood.   Labs on Admission: I have personally reviewed following labs and imaging studies  CBC: Recent Labs  Lab 04/17/21 1614  WBC 15.5*  NEUTROABS 12.7*  HGB 12.9  HCT 37.0  MCV 93.4   PLT 562   Basic Metabolic Panel: Recent Labs  Lab 04/17/21 1614  NA 137  K 3.2*  CL 104  CO2 24  GLUCOSE 223*  BUN 24*  CREATININE 0.95  CALCIUM 8.8*   Liver Function Tests: Recent Labs  Lab 04/17/21 1614  AST 26  ALT 23  ALKPHOS 47  BILITOT 0.7  PROT 6.5  ALBUMIN 3.6   Coagulation Profile: Recent Labs  Lab 04/17/21 1614  INR 0.9    Radiological Exams on Admission: DG Chest Portable 1 View  Result Date: 04/17/2021 CLINICAL DATA:  [place], left hip pain and fracture EXAM: PORTABLE CHEST 1 VIEW COMPARISON:  04/13/2020 FINDINGS: 2 frontal views of the chest demonstrate an unremarkable cardiac silhouette. Stable atherosclerosis of the aortic arch. No airspace disease, effusion, or pneumothorax. No acute bony abnormalities. IMPRESSION: 1. Stable chest, no acute process. Electronically Signed   By: Randa Ngo M.D.   On: 04/17/2021 17:12   DG Hip Unilat W or Wo Pelvis 2-3 Views Left  Result Date: 04/17/2021 CLINICAL DATA:  Pain status post fall EXAM: DG HIP (WITH OR WITHOUT PELVIS) 2-3V LEFT COMPARISON:  None. FINDINGS: There is acute displaced transcervical fracture of the proximal left femur. No dislocation. There are degenerative changes of the hips. There is diffuse osteopenia. IMPRESSION: Acute displaced fracture of the proximal left femur. Electronically Signed   By: Constance Holster M.D.   On: 04/17/2021 16:55    EKG: Pending.   Assessment/Plan Active Problems:   Closed left hip fracture (HCC)   Closed left hip fracture -status post mechanical fall. Pelvic x-ray shows acute displaced fracture of the proximal left femur.  Chest x-ray without acute abnormality. -Morphine 4 mg every 4 hourly as needed -N.p.o. midnight -Orthopedist, Dr. Amedeo Kinsman to see in a.m., plans for surgery tomorrow afternoon. -Obtain preop EKG - RL 75cc/hr x 20hrs  Hypokalemia- k 3.2 -Replete  Hypertension-stable. -Hold lisinopril HCTZ for now while hydrating  Dyslipidemia-  -Resume  atorvastatin  Chronic lower extremity swelling- R > L, chronic and unchanged.  Per Care everywhere DVT studies 10/2020 negative bilaterally.  DVT prophylaxis: Scds Code Status: Full Code Family Communication: None at bedside Disposition Plan: ~ 2 days Consults called: Ortho Admission status: Inpt, med surg I certify that at the point of admission it is my clinical judgment that the patient will require inpatient hospital care spanning beyond 2 midnights from the point of admission due to high intensity of service, high risk for further deterioration and high frequency of surveillance required.   Bethena Roys MD [facility]  04/17/2021, 10:19 PM

## 2021-04-17 NOTE — ED Triage Notes (Addendum)
Pt fell today while getting groceries inside. Pt c/o of L hip pain. Rotation and shortening noted to left hip.  Pt given 100 mcg of fentanyl by EMS

## 2021-04-17 NOTE — ED Notes (Signed)
Pt in er room number 10 via ems, per ems pt slipped and fell at home, pt c/o L hip pain, pt's L leg is shorter than the R, pt has strong pedal pulses and positive sensation to touch, pt reports increased pain with movement.

## 2021-04-17 NOTE — ED Notes (Signed)
Pt reports decreased pain, pt satting 88-90% on room air, pt placed on 2L O2 via [place], pt now satting mid 90s.

## 2021-04-18 ENCOUNTER — Inpatient Hospital Stay (HOSPITAL_COMMUNITY): Payer: Medicare Other | Admitting: Anesthesiology

## 2021-04-18 ENCOUNTER — Inpatient Hospital Stay (HOSPITAL_COMMUNITY): Payer: Medicare Other

## 2021-04-18 ENCOUNTER — Encounter (HOSPITAL_COMMUNITY)
Admission: EM | Disposition: A | Discharge status: Skilled Nursing Facility | Payer: Self-pay | Source: Home / Self Care | Attending: Internal Medicine

## 2021-04-18 ENCOUNTER — Encounter (HOSPITAL_COMMUNITY): Payer: Self-pay | Admitting: Internal Medicine

## 2021-04-18 DIAGNOSIS — W010XXA Fall on same level from slipping, tripping and stumbling without subsequent striking against object, initial encounter: Secondary | ICD-10-CM | POA: Diagnosis not present

## 2021-04-18 DIAGNOSIS — S72002A Fracture of unspecified part of neck of left femur, initial encounter for closed fracture: Secondary | ICD-10-CM | POA: Diagnosis not present

## 2021-04-18 HISTORY — PX: HIP ARTHROPLASTY: SHX981

## 2021-04-18 LAB — TYPE AND SCREEN
ABO/RH(D): A POS
Antibody Screen: NEGATIVE

## 2021-04-18 LAB — BASIC METABOLIC PANEL
Anion gap: 6 (ref 5–15)
BUN: 21 mg/dL (ref 8–23)
CO2: 26 mmol/L (ref 22–32)
Calcium: 8.8 mg/dL — ABNORMAL LOW (ref 8.9–10.3)
Chloride: 106 mmol/L (ref 98–111)
Creatinine, Ser: 0.91 mg/dL (ref 0.44–1.00)
GFR, Estimated: 60 mL/min — ABNORMAL LOW (ref >60)
Glucose, Bld: 131 mg/dL — ABNORMAL HIGH (ref 70–99)
Potassium: 4.1 mmol/L (ref 3.5–5.1)
Sodium: 138 mmol/L (ref 135–145)

## 2021-04-18 LAB — ABO/RH: ABO/RH(D): A POS

## 2021-04-18 LAB — SURGICAL PCR SCREEN
MRSA, PCR: POSITIVE — AB
Staphylococcus aureus: POSITIVE — AB

## 2021-04-18 SURGERY — HEMIARTHROPLASTY, HIP, DIRECT ANTERIOR APPROACH, FOR FRACTURE
Anesthesia: General | Site: Hip | Laterality: Left

## 2021-04-18 MED ORDER — PROPOFOL 500 MG/50ML IV EMUL
INTRAVENOUS | Status: DC | PRN
  Administered 2021-04-18: 120 mg via INTRAVENOUS

## 2021-04-18 MED ORDER — ORAL CARE MOUTH RINSE: 15.0000 mL | Freq: Once | OROMUCOSAL | Status: AC

## 2021-04-18 MED ORDER — LIDOCAINE HCL (PF) 2 % IJ SOLN
INTRAMUSCULAR | Status: AC
  Filled 2021-04-18: qty 5

## 2021-04-18 MED ORDER — PHENYLEPHRINE 40 MCG/ML (10ML) SYRINGE FOR IV PUSH (FOR BLOOD PRESSURE SUPPORT)
PREFILLED_SYRINGE | INTRAVENOUS | Status: AC
  Filled 2021-04-18: qty 10

## 2021-04-18 MED ORDER — SODIUM CHLORIDE 0.9 % IR SOLN
Status: DC | PRN
Start: 2021-04-18 — End: 2021-04-18
  Administered 2021-04-18: 3000 mL

## 2021-04-18 MED ORDER — VANCOMYCIN HCL 1000 MG IV SOLR
INTRAVENOUS | Status: DC | PRN
  Administered 2021-04-18: 1000 mg

## 2021-04-18 MED ORDER — PHENYLEPHRINE HCL (PRESSORS) 10 MG/ML IV SOLN
INTRAVENOUS | Status: DC | PRN
  Administered 2021-04-18 (×2): 80 ug via INTRAVENOUS
  Administered 2021-04-18 (×2): 120 ug via INTRAVENOUS
  Administered 2021-04-18: 80 ug via INTRAVENOUS
  Administered 2021-04-18: 120 ug via INTRAVENOUS
  Administered 2021-04-18 (×2): 80 ug via INTRAVENOUS

## 2021-04-18 MED ORDER — CHLORHEXIDINE GLUCONATE 0.12 % MT SOLN
15.0000 mL | Freq: Once | OROMUCOSAL | Status: AC
  Administered 2021-04-18: 15 mL via OROMUCOSAL

## 2021-04-18 MED ORDER — METOCLOPRAMIDE HCL 5 MG/ML IJ SOLN
INTRAMUSCULAR | Status: AC
  Filled 2021-04-18: qty 2

## 2021-04-18 MED ORDER — BUPIVACAINE-EPINEPHRINE (PF) 0.5% -1:200000 IJ SOLN
INTRAMUSCULAR | Status: AC
  Filled 2021-04-18: qty 30

## 2021-04-18 MED ORDER — SUCCINYLCHOLINE CHLORIDE 200 MG/10ML IV SOSY
PREFILLED_SYRINGE | INTRAVENOUS | Status: AC
  Filled 2021-04-18: qty 10

## 2021-04-18 MED ORDER — BUPIVACAINE-EPINEPHRINE (PF) 0.5% -1:200000 IJ SOLN
INTRAMUSCULAR | Status: DC | PRN
  Administered 2021-04-18: 30 mL

## 2021-04-18 MED ORDER — PROPOFOL 10 MG/ML IV BOLUS
INTRAVENOUS | Status: AC
  Filled 2021-04-18: qty 20

## 2021-04-18 MED ORDER — VANCOMYCIN HCL 1000 MG IV SOLR
INTRAVENOUS | Status: AC
  Filled 2021-04-18: qty 1000

## 2021-04-18 MED ORDER — DEXAMETHASONE SODIUM PHOSPHATE 4 MG/ML IJ SOLN
INTRAMUSCULAR | Status: DC | PRN
  Administered 2021-04-18: 10 mg via INTRAVENOUS

## 2021-04-18 MED ORDER — MUPIROCIN 2 % EX OINT
1.0000 "application " | TOPICAL_OINTMENT | Freq: Two times a day (BID) | CUTANEOUS | Status: DC
  Administered 2021-04-19 – 2021-04-20 (×3): 1 via NASAL
  Filled 2021-04-18: qty 22

## 2021-04-18 MED ORDER — FENTANYL CITRATE (PF) 100 MCG/2ML IJ SOLN
INTRAMUSCULAR | Status: AC
  Filled 2021-04-18: qty 2

## 2021-04-18 MED ORDER — ROCURONIUM BROMIDE 10 MG/ML (PF) SYRINGE
PREFILLED_SYRINGE | INTRAVENOUS | Status: AC
  Filled 2021-04-18: qty 10

## 2021-04-18 MED ORDER — SUCCINYLCHOLINE CHLORIDE 20 MG/ML IJ SOLN
INTRAMUSCULAR | Status: DC | PRN
  Administered 2021-04-18: 120 mg via INTRAVENOUS

## 2021-04-18 MED ORDER — FENTANYL CITRATE (PF) 100 MCG/2ML IJ SOLN
INTRAMUSCULAR | Status: DC | PRN
  Administered 2021-04-18: 25 ug via INTRAVENOUS
  Administered 2021-04-18 (×2): 50 ug via INTRAVENOUS
  Administered 2021-04-18 (×2): 25 ug via INTRAVENOUS
  Administered 2021-04-18: 50 ug via INTRAVENOUS
  Administered 2021-04-18: 25 ug via INTRAVENOUS

## 2021-04-18 MED ORDER — BUPIVACAINE HCL (PF) 0.5 % IJ SOLN
INTRAMUSCULAR | Status: AC
  Filled 2021-04-18: qty 30

## 2021-04-18 MED ORDER — LACTATED RINGERS IV SOLN: INTRAVENOUS | Status: DC

## 2021-04-18 MED ORDER — SUGAMMADEX SODIUM 200 MG/2ML IV SOLN
INTRAVENOUS | Status: DC | PRN
  Administered 2021-04-18: 149.6 mg via INTRAVENOUS

## 2021-04-18 MED ORDER — ONDANSETRON HCL 4 MG/2ML IJ SOLN
INTRAMUSCULAR | Status: DC | PRN
  Administered 2021-04-18: 4 mg via INTRAVENOUS

## 2021-04-18 MED ORDER — 0.9 % SODIUM CHLORIDE (POUR BTL) OPTIME
TOPICAL | Status: DC | PRN
  Administered 2021-04-18: 1000 mL

## 2021-04-18 MED ORDER — CEFAZOLIN SODIUM-DEXTROSE 2-4 GM/100ML-% IV SOLN
INTRAVENOUS | Status: AC
  Administered 2021-04-19: 2 g via INTRAVENOUS
  Filled 2021-04-18: qty 100

## 2021-04-18 MED ORDER — ASPIRIN 81 MG PO CHEW
81.0000 mg | CHEWABLE_TABLET | Freq: Two times a day (BID) | ORAL | Status: DC
  Filled 2021-04-18 (×4): qty 1

## 2021-04-18 MED ORDER — ROCURONIUM BROMIDE 100 MG/10ML IV SOLN
INTRAVENOUS | Status: DC | PRN
  Administered 2021-04-18: 50 mg via INTRAVENOUS

## 2021-04-18 MED ORDER — HALOPERIDOL LACTATE 5 MG/ML IJ SOLN: 2.0000 mg | Freq: Once | INTRAMUSCULAR | Status: DC

## 2021-04-18 MED ORDER — PHENYLEPHRINE HCL-NACL 10-0.9 MG/250ML-% IV SOLN
INTRAVENOUS | Status: AC
  Filled 2021-04-18: qty 250

## 2021-04-18 MED ORDER — HYDROMORPHONE HCL 1 MG/ML IJ SOLN: 0.2500 mg | INTRAMUSCULAR | Status: DC | PRN

## 2021-04-18 MED ORDER — ONDANSETRON HCL 4 MG/2ML IJ SOLN
INTRAMUSCULAR | Status: AC
  Filled 2021-04-18: qty 2

## 2021-04-18 MED ORDER — HALOPERIDOL LACTATE 5 MG/ML IJ SOLN
2.0000 mg | Freq: Once | INTRAMUSCULAR | Status: AC
  Administered 2021-04-18: 2 mg via INTRAMUSCULAR
  Filled 2021-04-18: qty 1

## 2021-04-18 MED ORDER — FENTANYL CITRATE (PF) 100 MCG/2ML IJ SOLN
INTRAMUSCULAR | Status: AC
  Filled 2021-04-18: qty 4

## 2021-04-18 MED ORDER — DEXAMETHASONE SODIUM PHOSPHATE 10 MG/ML IJ SOLN
INTRAMUSCULAR | Status: AC
  Filled 2021-04-18: qty 1

## 2021-04-18 MED ORDER — LORAZEPAM 2 MG/ML IJ SOLN
2.0000 mg | Freq: Once | INTRAMUSCULAR | Status: AC
  Administered 2021-04-18: 2 mg via INTRAMUSCULAR
  Filled 2021-04-18: qty 1

## 2021-04-18 MED ORDER — TRANEXAMIC ACID-NACL 1000-0.7 MG/100ML-% IV SOLN
INTRAVENOUS | Status: AC
  Filled 2021-04-18: qty 100

## 2021-04-18 MED ORDER — METOCLOPRAMIDE HCL 5 MG/ML IJ SOLN
INTRAMUSCULAR | Status: DC | PRN
  Administered 2021-04-18 (×2): 5 mg via INTRAVENOUS

## 2021-04-18 MED ORDER — CHLORHEXIDINE GLUCONATE CLOTH 2 % EX PADS
6.0000 | MEDICATED_PAD | Freq: Every day | CUTANEOUS | Status: DC
  Administered 2021-04-20: 6 via TOPICAL

## 2021-04-18 MED ORDER — CEFAZOLIN SODIUM-DEXTROSE 2-4 GM/100ML-% IV SOLN
2.0000 g | Freq: Three times a day (TID) | INTRAVENOUS | Status: AC
  Administered 2021-04-19 (×2): 2 g via INTRAVENOUS
  Filled 2021-04-18 (×3): qty 100

## 2021-04-18 SURGICAL SUPPLY — 63 items
APL PRP STRL LF DISP 70% ISPRP (MISCELLANEOUS) ×2
BIPOLAR PROS AML 46 (Hips) ×2 IMPLANT
BIT DRILL 2.8X128 (BIT) ×2 IMPLANT
BLADE SAGITTAL 25.0X1.27X90 (BLADE) ×2 IMPLANT
BRUSH FEMORAL CANAL (MISCELLANEOUS) ×2 IMPLANT
CEMENT HV SMART SET (Cement) ×4 IMPLANT
CENTRALIZER STEM HIP 12MM (Hips) ×2 IMPLANT
CHLORAPREP W/TINT 26 (MISCELLANEOUS) ×4 IMPLANT
CLOTH BEACON ORANGE TIMEOUT ST (SAFETY) ×2 IMPLANT
COVER LIGHT HANDLE STERIS (MISCELLANEOUS) ×4 IMPLANT
COVER WAND RF STERILE (DRAPES) ×2 IMPLANT
DECANTER SPIKE VIAL GLASS SM (MISCELLANEOUS) ×2 IMPLANT
DRAPE HIP W/POCKET STRL (MISCELLANEOUS) ×2 IMPLANT
DRAPE SURG 17X23 STRL (DRAPES) ×2 IMPLANT
DRAPE U-SHAPE 47X51 STRL (DRAPES) ×2 IMPLANT
DRESSING AQUACEL AG ADV 3.5X12 (MISCELLANEOUS) ×1 IMPLANT
DRSG AQUACEL AG ADV 3.5X12 (MISCELLANEOUS) ×2
DRSG MEPILEX SACRM 8.7X9.8 (GAUZE/BANDAGES/DRESSINGS) ×2 IMPLANT
ELECT REM PT RETURN 9FT ADLT (ELECTROSURGICAL) ×2
ELECTRODE REM PT RTRN 9FT ADLT (ELECTROSURGICAL) ×1 IMPLANT
GLOVE SKINSENSE NS SZ8.0 LF (GLOVE) ×4
GLOVE SKINSENSE STRL SZ8.0 LF (GLOVE) ×4 IMPLANT
GLOVE SRG 8 PF TXTR STRL LF DI (GLOVE) ×1 IMPLANT
GLOVE SURG UNDER POLY LF SZ7 (GLOVE) ×6 IMPLANT
GLOVE SURG UNDER POLY LF SZ8 (GLOVE) ×2
GOWN STRL REUS W/ TWL XL LVL3 (GOWN DISPOSABLE) ×1 IMPLANT
GOWN STRL REUS W/TWL LRG LVL3 (GOWN DISPOSABLE) ×8 IMPLANT
GOWN STRL REUS W/TWL XL LVL3 (GOWN DISPOSABLE) ×2
HANDPIECE INTERPULSE COAX TIP (DISPOSABLE) ×2
HEAD BIPOLAR PROS AML 46 (Hips) ×1 IMPLANT
HEAD FEM STD 28X+1.5 STRL (Hips) ×2 IMPLANT
INST SET MAJOR BONE (KITS) ×2 IMPLANT
IV NS IRRIG 3000ML ARTHROMATIC (IV SOLUTION) ×2 IMPLANT
KIT BLADEGUARD II DBL (SET/KITS/TRAYS/PACK) ×2 IMPLANT
KIT TURNOVER KIT A (KITS) ×2 IMPLANT
MANIFOLD NEPTUNE II (INSTRUMENTS) ×2 IMPLANT
MARKER SKIN DUAL TIP RULER LAB (MISCELLANEOUS) ×2 IMPLANT
NEEDLE HYPO 18GX1.5 BLUNT FILL (NEEDLE) ×2 IMPLANT
NEEDLE HYPO 21X1.5 SAFETY (NEEDLE) ×2 IMPLANT
NEEDLE MAYO 6 CRC TAPER PT (NEEDLE) ×2 IMPLANT
NS IRRIG 1000ML POUR BTL (IV SOLUTION) ×2 IMPLANT
PACK SURGICAL SETUP 50X90 (CUSTOM PROCEDURE TRAY) ×2 IMPLANT
PACK TOTAL JOINT (CUSTOM PROCEDURE TRAY) ×2 IMPLANT
PAD ARMBOARD 7.5X6 YLW CONV (MISCELLANEOUS) ×2 IMPLANT
PASSER SUT SWANSON 36MM LOOP (INSTRUMENTS) IMPLANT
PENCIL SMOKE EVACUATOR (MISCELLANEOUS) ×2 IMPLANT
PIN STMN SNGL STERILE 9X3.6MM (PIN) ×4 IMPLANT
PRESSURIZER FEMORAL UNIV (MISCELLANEOUS) ×2 IMPLANT
RESTRICTOR CEMENT PE SZ 2 (Cement) ×2 IMPLANT
SET BASIN LINEN APH (SET/KITS/TRAYS/PACK) ×2 IMPLANT
SET HNDPC FAN SPRY TIP SCT (DISPOSABLE) ×1 IMPLANT
STEM SUMMIT CEMENT BASIC SZ4 (Hips) ×2 IMPLANT
STRIP CLOSURE SKIN 1/2X4 (GAUZE/BANDAGES/DRESSINGS) ×4 IMPLANT
SUT MNCRL AB 4-0 PS2 18 (SUTURE) ×2 IMPLANT
SUT MON AB 2-0 CT1 36 (SUTURE) ×4 IMPLANT
SUT VIC AB 1 CT1 27 (SUTURE) ×14
SUT VIC AB 1 CT1 27XBRD ANTBC (SUTURE) ×7 IMPLANT
SYR 20ML LL LF (SYRINGE) ×6 IMPLANT
SYR BULB IRRIG 60ML STRL (SYRINGE) ×2 IMPLANT
TOWER CARTRIDGE SMART MIX (DISPOSABLE) ×2 IMPLANT
TRAY FOLEY MTR SLVR 16FR STAT (SET/KITS/TRAYS/PACK) ×2 IMPLANT
WATER STERILE IRR 1000ML POUR (IV SOLUTION) ×4 IMPLANT
YANKAUER SUCT 12FT TUBE ARGYLE (SUCTIONS) ×2 IMPLANT

## 2021-04-18 NOTE — Progress Notes (Signed)
Received new order for IM Haldol. Administered to patient.

## 2021-04-18 NOTE — Anesthesia Postprocedure Evaluation (Signed)
Anesthesia Post Note  Patient: Dominique Farrell  Procedure(s) Performed: ARTHROPLASTY BIPOLAR HIP (HEMIARTHROPLASTY) (Left Hip)  Patient location during evaluation: PACU Anesthesia Type: General Level of consciousness: awake and alert and oriented Pain management: pain level controlled Vital Signs Assessment: post-procedure vital signs reviewed and stable Respiratory status: spontaneous breathing and respiratory function stable Cardiovascular status: blood pressure returned to baseline and stable Postop Assessment: no apparent nausea or vomiting Anesthetic complications: no   No complications documented.   Last Vitals:  Vitals:   04/18/21 1003 04/18/21 1140  BP: (!) 148/64 (!) 157/71  Pulse: 83 86  Resp:  (!) 22  Temp: 36.8 C 36.7 C  SpO2: (!) 81% 97%    Last Pain:  Vitals:   04/18/21 1140  TempSrc: Oral  PainSc: 0-No pain                 Harshith Pursell C Caidyn Blossom

## 2021-04-18 NOTE — Progress Notes (Signed)
Patient refusing medications. Patient still refusing to keep oxygen in. O2 sat 86. Patient is showing no signs of SOB. MD Zierle-Ghosh made aware.

## 2021-04-18 NOTE — Progress Notes (Addendum)
Patient's O2 saturation on RA was 78%. Patient stated they did not feel SOB. Patient was placed on 2L with a saturation of 86%. Put patient on 3L with a saturation of 91%. mD Shahmehdi notified.

## 2021-04-18 NOTE — TOC Initial Note (Signed)
Transition of Care [facility]) - Initial/Assessment Note    Patient Details  Name: Dominique Farrell MRN: [id number] Date of Birth: 12/03/1928  [facility] of [facility]) CM/SW Contact:    Boneta Lucks, RN Phone Number: 04/18/2021, 10:49 AM  Clinical Narrative:    Patient with closed left hip fracture. Patient lives in [place] with her sister. Patient will go to surgery today, she realizes she will need rehab and is requesting [facility].  TOC will send out for bed offers. Patient has had COVID vaccine and Booster.               Expected Discharge Plan: Skilled Nursing Facility Barriers to Discharge: Continued Medical Work up   Patient Goals and CMS Choice Patient states their goals for this hospitalization and ongoing recovery are:: to go to SNF. CMS Medicare.gov Compare Post Acute Care list provided to:: Patient Choice offered to / list presented to : Patient  Expected Discharge Plan and Services Expected Discharge Plan: [place]     Prior Living Arrangements/Services     Patient language and need for interpreter reviewed:: No        Need for Family Participation in Patient Care: Yes (Comment) Care giver support system in place?: Yes (comment)   Criminal Activity/Legal Involvement Pertinent to Current Situation/Hospitalization: No - Comment as needed  Activities of Daily Living Home Assistive Devices/Equipment: None ADL Screening (condition at time of admission) Patient's cognitive ability adequate to safely complete daily activities?: Yes Is the patient deaf or have difficulty hearing?: No Does the patient have difficulty seeing, even when wearing glasses/contacts?: No Does the patient have difficulty concentrating, remembering, or making decisions?: No Patient able to express need for assistance with ADLs?: Yes Does the patient have difficulty dressing or bathing?: No Independently performs ADLs?: Yes (appropriate for developmental age) Does the patient have difficulty  walking or climbing stairs?: Yes Weakness of Legs: Left Weakness of Arms/Hands: None  Permission Sought/Granted   Emotional Assessment     Affect (typically observed): Accepting,Pleasant Orientation: : Oriented to Self,Oriented to Place,Oriented to  Time,Oriented to Situation Alcohol / Substance Use: Not Applicable Psych Involvement: No (comment)  Admission diagnosis:  Closed left hip fracture (HCC) [S72.002A] Closed fracture of neck of left femur, initial encounter ([place]) [S72.002A] Patient Active Problem List   Diagnosis Date Noted  . Closed left hip fracture ([place]) 04/17/2021   PCP:  Monico Blitz, MD Pharmacy:   [facility] [place] [address] [place] [address] Phone: [phone] Fax: [phone]  [facility] [phone] - [place], [place] - [address] [place] [address] Phone: [phone] Fax: [phone]   Readmission Risk Interventions Readmission Risk Prevention Plan 04/18/2021  Medication Screening Complete  Transportation Screening Complete  Some recent data might be hidden

## 2021-04-18 NOTE — Plan of Care (Signed)

## 2021-04-18 NOTE — Progress Notes (Signed)
Patient confused. Ripped out both IVs, refusing a new one. Refusing to keep nasal canula in. Patient is requesting to speak to doctor. Stated she wants the foley removed. On call MD Zierle-Ghosh notified.

## 2021-04-18 NOTE — NC FL2 (Signed)
[place] MEDICAID FL2 LEVEL OF CARE SCREENING TOOL     IDENTIFICATION  Patient Name: Dominique Farrell Birthdate: 04-27-1928 Sex: female Admission Date (Current Location): 04/17/2021  [facility] and [place] Number:  [employer] and Address:  [facility] [address], [place]      Provider Number: [id number]  Attending Physician Name and Address:  Deatra James, MD  Relative Name and Phone Number:  Jana Half - sister [phone]    Current Level of Care: Hospital Recommended Level of Care: [place] Prior Approval Number:    Date Approved/Denied:   PASRR Number: [id number]  Discharge Plan: SNF    Current Diagnoses: Patient Active Problem List   Diagnosis Date Noted  . Closed left hip fracture ([place]) 04/17/2021    Orientation RESPIRATION BLADDER Height & Weight     Self,Time,Situation,Place  Normal External catheter Weight: 74.8 kg Height:  5\' 6"  (167.6 cm)  BEHAVIORAL SYMPTOMS/MOOD NEUROLOGICAL BOWEL NUTRITION STATUS      Continent Diet (See DC summary)  AMBULATORY STATUS COMMUNICATION OF NEEDS Skin   Extensive Assist Verbally Surgical wounds (left hip)                       Personal Care Assistance Level of Assistance  Bathing,Feeding,Dressing Bathing Assistance: Maximum assistance   Dressing Assistance: Maximum assistance     Functional Limitations Info  Sight,Hearing,Speech Sight Info: Adequate Hearing Info: Adequate Speech Info: Adequate    SPECIAL CARE FACTORS FREQUENCY  PT (By licensed PT)     PT Frequency: 5 times a week              Contractures Contractures Info: Not present    Additional Factors Info  Code Status,Allergies Code Status Info: FULL Allergies Info: Sulfa           Current Medications (04/18/2021):  This is the current hospital active medication list Current Facility-Administered Medications  Medication Dose Route Frequency Provider Last Rate Last Admin   . acetaminophen (TYLENOL) tablet 650 mg  650 mg Oral Q6H PRN Emokpae, Ejiroghene E, MD       Or  . acetaminophen (TYLENOL) suppository 650 mg  650 mg Rectal Q6H PRN Emokpae, Ejiroghene E, MD      . ceFAZolin (ANCEF) IVPB 2g/100 mL premix  2 g Intravenous On Call to OR Mordecai Rasmussen, MD      . Chlorhexidine Gluconate Cloth 2 % PADS 6 each  6 each Topical Q0600 Shahmehdi, Seyed A, MD      . lactated ringers infusion   Intravenous Continuous Emokpae, Ejiroghene E, MD 75 mL/hr at 04/18/21 0656 Infusion Verify at 04/18/21 0656  . morphine 4 MG/ML injection 4 mg  4 mg Intravenous Q4H PRN Emokpae, Ejiroghene E, MD   4 mg at 04/18/21 0905  . mupirocin ointment (BACTROBAN) 2 % 1 application  1 application Nasal BID Shahmehdi, Seyed A, MD      . ondansetron (ZOFRAN) tablet 4 mg  4 mg Oral Q6H PRN Emokpae, Ejiroghene E, MD       Or  . ondansetron (ZOFRAN) injection 4 mg  4 mg Intravenous Q6H PRN Emokpae, Ejiroghene E, MD      . polyethylene glycol (MIRALAX / GLYCOLAX) packet 17 g  17 g Oral Daily PRN Emokpae, Ejiroghene E, MD      . tranexamic acid (CYKLOKAPRON) IVPB 1,000 mg  1,000 mg Intravenous To OR Emokpae, Ejiroghene E, MD  Discharge Medications: Please see discharge summary for a list of discharge medications.  Relevant Imaging Results:  Relevant Lab Results:   Additional Information SS# [id number]  Boneta Lucks, RN

## 2021-04-18 NOTE — Progress Notes (Signed)
Patient is experiencing post anesthesia delirium. She is confused, combative, and refusing all care. She has pulled out her IVs and removed her abduction pillow. Haldol given IM - patient briefly asleep, but then back to being combative. Ativan IM ordered with restraints.  At bedside - patient is delirious. Multiple attempts at re-orienting, explaining medications/procedures nurses are trying to do. Patient repeats "please help me" over and over and over. She also repeatedly says that her family will find her here, and that she is going to walk out. Unsuccessful attempt made by nursing to call family so that they could talk to her. Ativan given. Patient was able to calm down enough to get IV access and start ancef.

## 2021-04-18 NOTE — Progress Notes (Signed)
PROGRESS NOTE    Patient: Dominique Farrell                            PCP: Monico Blitz, MD                    DOB: 1928-04-19            DOA: 04/17/2021 [id number]             DOS: 04/18/2021, 1:49 PM   LOS: 1 day   Date of Service: The patient was seen and examined on 04/18/2021  Subjective:   The patient was seen and examined this morning. Stable at this time. Still complaining of:  hip pain Otherwise no issues overnight .  Brief Narrative:   Maple Mowat is a 87 y.o. female with medical history significant for hypertension, dyslipidemia. Patient presented to the ED with reports of a accidental fall, sustaining a left hip injury, fracture.   ED Course: Stable vitals.  Acute displaced fracture of the proximal left femur.  Portable chest x-ray without acute abnormality.  ED provider talked to orthopedist Dr. Amedeo Kinsman, okay to admit here, will see patient in consult.   Assessment & Plan:   Active Problems:   Closed left hip fracture (HCC)   Closed left hip fracture  -Medically stable still complaining of left hip pain -status post mechanical fall. Pelvic x-ray shows acute displaced fracture of the proximal left femur.  Chest x-ray without acute abnormality. -Morphine 4 mg every 4 hourly as needed -N.p.o. midnight -Orthopedist, Dr. Amedeo Kinsman to see in a.m., plans for surgery tomorrow afternoon. -Obtain preop EKG - RL 75cc/hr x 20hrs -We will continue to monitor closely postop  Hypokalemia-  - k 3.2, 4.1 -Replete  Hypertension-remained stable -Hold lisinopril HCTZ for now while hydrating  Dyslipidemia-  -Resume atorvastatin -postop  Chronic lower extremity swelling- R > L, chronic and unchanged.   Per Care everywhere DVT studies 10/2020 negative bilaterally. Continue to monitor     --------------------------------------------------------------------------------------------------------------------------------------- Cultures; None   Antimicrobials: None     Consultants: Ortho    ---------------------------------------------------------------------------------------------------------------------------------------  DVT prophylaxis:  SCD/Compression stockings Code Status:   Code Status: Full Code  Family Communication: No family member present at bedside- attempt will be made to update daily The above findings and plan of care has been discussed with patient (and family)  in detail,  they expressed understanding and agreement of above. -Advance care planning has been discussed.   Admission status:   Status is: Inpatient  Remains inpatient appropriate because:Inpatient level of care appropriate due to severity of illness   Dispo: The patient is from: Home              Anticipated d/c is to: TBD in 1-2 days HH vs SNF               Patient currently is not medically stable to d/c.   Difficult to place patient No      Level of care: Med-Surg   Procedures:      Antimicrobials:  Anti-infectives (From admission, onward)   Start     Dose/Rate Route Frequency Ordered Stop   04/18/21 1144  ceFAZolin (ANCEF) 2-4 GM/100ML-% IVPB       Note to Pharmacy: Abbie Sons   : cabinet override      04/18/21 1144 04/18/21 2359   04/18/21 0600  [MAR Hold]  ceFAZolin (ANCEF) IVPB 2g/100 mL premix        (  MAR Hold since Tue 04/18/2021 at 1128.Hold Reason: Transfer to a Procedural area.)   2 g 200 mL/hr over 30 Minutes Intravenous On call to O.R. 04/17/21 2136 04/19/21 0559       Medication:  . [MAR Hold] Chlorhexidine Gluconate Cloth  6 each Topical Q0600  . [MAR Hold] mupirocin ointment  1 application Nasal BID    [MAR Hold] acetaminophen **OR** [MAR Hold] acetaminophen, [MAR Hold] morphine, [MAR Hold] ondansetron **OR** [MAR Hold] ondansetron (ZOFRAN) IV, [MAR Hold] polyethylene glycol   Objective:   Vitals:   04/18/21 0217 04/18/21 0448 04/18/21 1003 04/18/21 1140  BP: (!) 159/80 (!) 159/61 (!) 148/64 (!) 157/71  Pulse: 80  76 83 86  Resp: 18 18  (!) 22  Temp: 98.6 F (37 C) 97.7 F (36.5 C) 98.2 F (36.8 C) 98.1 F (36.7 C)  TempSrc:  Oral Oral Oral  SpO2: 91% 91% (!) 81% 97%  Weight:      Height:        Intake/Output Summary (Last 24 hours) at 04/18/2021 1349 Last data filed at 04/18/2021 1322 Gross per 24 hour  Intake 2151.61 ml  Output 300 ml  Net 1851.61 ml   Filed Weights   04/17/21 1513  Weight: 74.8 kg     Examination:   Physical Exam  Constitution:  Alert, cooperative, no distress,  Appears calm and comfortable  Psychiatric: Normal and stable mood and affect, cognition intact,   HEENT: Normocephalic, PERRL, otherwise with in Normal limits  Chest:Chest symmetric Cardio vascular:  S1/S2, RRR, No murmure, No Rubs or Gallops  pulmonary: Clear to auscultation bilaterally, respirations unlabored, negative wheezes / crackles Abdomen: Soft, non-tender, non-distended, bowel sounds,no masses, no organomegaly Muscular skeletal:  Left hip pain, limited range of motion at left hip  Limited exam - in bed, able to move all 4 extremities,  Neuro: CNII-XII intact. , normal motor and sensation, reflexes intact  Extremities: No pitting edema lower extremities, +2 pulses  Skin: Dry, warm to touch, negative for any Rashes, No open wounds Wounds: per nursing documentation    ------------------------------------------------------------------------------------------------------------------------------------------    LABs:  CBC Latest Ref Rng & Units 04/17/2021 03/20/2016  WBC 4.0 - 10.5 K/uL 15.5(H) 9.1  Hemoglobin 12.0 - 15.0 g/dL 12.9 13.2  Hematocrit 36.0 - 46.0 % 37.0 37.9  Platelets 150 - 400 K/uL 249 255   CMP Latest Ref Rng & Units 04/18/2021 04/17/2021 03/20/2016  Glucose 70 - 99 mg/dL 131(H) 223(H) 81  BUN 8 - 23 mg/dL 21 24(H) 26(H)  Creatinine 0.44 - 1.00 mg/dL 0.91 0.95 1.23(H)  Sodium 135 - 145 mmol/L 138 137 141  Potassium 3.5 - 5.1 mmol/L 4.1 3.2(L) 3.7  Chloride 98 - 111 mmol/L 106 104 108   CO2 22 - 32 mmol/L 26 24 24   Calcium 8.9 - 10.3 mg/dL 8.8(L) 8.8(L) 8.8(L)  Total Protein 6.5 - 8.1 g/dL - 6.5 -  Total Bilirubin 0.3 - 1.2 mg/dL - 0.7 -  Alkaline Phos 38 - 126 U/L - 47 -  AST 15 - 41 U/L - 26 -  ALT 0 - 44 U/L - 23 -       Micro Results Recent Results (from the past 240 hour(s))  Resp Panel by RT-PCR (Flu A&B, Covid) Nasopharyngeal Swab     Status: None   Collection Time: 04/17/21  3:40 PM   Specimen: Nasopharyngeal Swab; Nasopharyngeal(NP) swabs in vial transport medium  Result Value Ref Range Status   SARS Coronavirus 2 by RT PCR NEGATIVE NEGATIVE  Final    Comment: (NOTE) SARS-CoV-2 target nucleic acids are NOT DETECTED.  The SARS-CoV-2 RNA is generally detectable in upper respiratory specimens during the acute phase of infection. The lowest concentration of SARS-CoV-2 viral copies this assay can detect is 138 copies/mL. A negative result does not preclude SARS-Cov-2 infection and should not be used as the sole basis for treatment or other patient management decisions. A negative result may occur with  improper specimen collection/handling, submission of specimen other than nasopharyngeal swab, presence of viral mutation(s) within the areas targeted by this assay, and inadequate number of viral copies(<138 copies/mL). A negative result must be combined with clinical observations, patient history, and epidemiological information. The expected result is Negative.  Fact Sheet for Patients:  EntrepreneurPulse.com.au  Fact Sheet for Healthcare Providers:  IncredibleEmployment.be  This test is no t yet approved or cleared by the [place] FDA and  has been authorized for detection and/or diagnosis of SARS-CoV-2 by FDA under an Emergency Use Authorization (EUA). This EUA will remain  in effect (meaning this test can be used) for the duration of the COVID-19 declaration under Section 564(b)(1) of the Act,  21 U.S.C.section 360bbb-3(b)(1), unless the authorization is terminated  or revoked sooner.       Influenza A by PCR NEGATIVE NEGATIVE Final   Influenza B by PCR NEGATIVE NEGATIVE Final    Comment: (NOTE) The Xpert Xpress SARS-CoV-2/FLU/RSV plus assay is intended as an aid in the diagnosis of influenza from Nasopharyngeal swab specimens and should not be used as a sole basis for treatment. Nasal washings and aspirates are unacceptable for Xpert Xpress SARS-CoV-2/FLU/RSV testing.  Fact Sheet for Patients: EntrepreneurPulse.com.au  Fact Sheet for Healthcare Providers: IncredibleEmployment.be  This test is not yet approved or cleared by the [place] FDA and has been authorized for detection and/or diagnosis of SARS-CoV-2 by FDA under an Emergency Use Authorization (EUA). This EUA will remain in effect (meaning this test can be used) for the duration of the COVID-19 declaration under Section 564(b)(1) of the Act, 21 U.S.C. section 360bbb-3(b)(1), unless the authorization is terminated or revoked.  Performed at [facility], [address], [place], [place] [address]   Surgical pcr screen     Status: Abnormal   Collection Time: 04/18/21  8:09 AM   Specimen: Nasal Mucosa; Nasal Swab  Result Value Ref Range Status   MRSA, PCR POSITIVE (A) NEGATIVE Final    Comment: CRITICAL RESULT CALLED TO, READ BACK BY AND VERIFIED WITH: TORI LEONARD 04/18/21 @ 1043 BY S BEARD    Staphylococcus aureus POSITIVE (A) NEGATIVE Final    Comment: (NOTE) The Xpert SA Assay (FDA approved for NASAL specimens in patients 74 years of age and older), is one component of a comprehensive surveillance program. It is not intended to diagnose infection nor to guide or monitor treatment. Performed at [facility], [address], [place], [place] [address]     Radiology Reports DG Chest Portable 1 View  Result Date: 04/17/2021 CLINICAL DATA:  [place], left hip pain and  fracture EXAM: PORTABLE CHEST 1 VIEW COMPARISON:  04/13/2020 FINDINGS: 2 frontal views of the chest demonstrate an unremarkable cardiac silhouette. Stable atherosclerosis of the aortic arch. No airspace disease, effusion, or pneumothorax. No acute bony abnormalities. IMPRESSION: 1. Stable chest, no acute process. Electronically Signed   By: Randa Ngo M.D.   On: 04/17/2021 17:12   DG Hip Unilat W or Wo Pelvis 2-3 Views Left  Result Date: 04/17/2021 CLINICAL DATA:  Pain status  post fall EXAM: DG HIP (WITH OR WITHOUT PELVIS) 2-3V LEFT COMPARISON:  None. FINDINGS: There is acute displaced transcervical fracture of the proximal left femur. No dislocation. There are degenerative changes of the hips. There is diffuse osteopenia. IMPRESSION: Acute displaced fracture of the proximal left femur. Electronically Signed   By: Constance Holster M.D.   On: 04/17/2021 16:55    SIGNED: Deatra James, MD, FHM. [facility],  Pager (please use [email or web address] to page/text) Please use Epic Secure Chat for non-urgent communication (7AM-7PM)  If 7PM-7AM, please contact night-coverage [email or web address], 04/18/2021, 1:49 PM

## 2021-04-18 NOTE — Transfer of Care (Signed)
Immediate Anesthesia Transfer of Care Note  Patient: Dominique Farrell  Procedure(s) Performed: ARTHROPLASTY BIPOLAR HIP (HEMIARTHROPLASTY) (Left Hip)  Patient Location: PACU  Anesthesia Type:General  Level of Consciousness: awake, alert , oriented and sedated  Airway & Oxygen Therapy: Patient Spontanous Breathing and Patient connected to nasal cannula oxygen  Post-op Assessment: Report given to RN and Post -op Vital signs reviewed and stable  Post vital signs: Reviewed and stable  Last Vitals:  Vitals Value Taken Time  BP 145/124 04/18/21 1715  Temp 98.1   Pulse 95 04/18/21 1720  Resp 15 04/18/21 1720  SpO2 89 % 04/18/21 1720  Vitals shown include unvalidated device data.  Last Pain:  Vitals:   04/18/21 1140  TempSrc: Oral  PainSc: 0-No pain         Complications: No complications documented.

## 2021-04-18 NOTE — Progress Notes (Signed)
MD Zierle-Ghosh at bedside. New orders placed for soft wrist/waist restraint and IM Ativan. IM ativan given. Restraint not put on at this time. Waiting to see if medication calms patient down to be able to put an IV in to administer her antibiotic.

## 2021-04-18 NOTE — Anesthesia Procedure Notes (Signed)
Procedure Name: Intubation Date/Time: 04/18/2021 2:03 PM Performed by: Jonna Munro, CRNA Pre-anesthesia Checklist: Patient identified, Emergency Drugs available, Suction available, Patient being monitored and Timeout performed Patient Re-evaluated:Patient Re-evaluated prior to induction Oxygen Delivery Method: Circle system utilized Preoxygenation: Pre-oxygenation with 100% oxygen Induction Type: IV induction Laryngoscope Size: Glidescope and 3 Grade View: Grade I Tube type: Oral Tube size: 7.0 mm Number of attempts: 1 Airway Equipment and Method: Stylet and Video-laryngoscopy Placement Confirmation: ETT inserted through vocal cords under direct vision,  positive ETCO2 and breath sounds checked- equal and bilateral Secured at: 21 cm Tube secured with: Tape Dental Injury: Teeth and Oropharynx as per pre-operative assessment

## 2021-04-18 NOTE — Consult Note (Signed)
ORTHOPAEDIC CONSULTATION  REQUESTING PHYSICIAN: Kendell Bane, MD  ASSESSMENT AND PLAN: 87 y.o. female with the following: Left Hip Displaced femoral neck fracture  This patient requires inpatient admission to the hospitalist, to include preoperative clearance and perioperative medical management  - Weight Bearing Status/Activity: NWB Left lower extremity  - Additional recommended labs/tests: Preop Labs: CBC, BMP, PT/INR, Chest XR and EKG  -VTE Prophylaxis: Please hold prior to OR; to resume POD#1 at the discretion of the primary team  - Pain control: Recommend PO pain medications PRN; judicious use of narcotics  - Follow-up plan: F/u 10-14 days postop  -Procedures: Plan for OR once patient has been medically optimized  Plan for Left Hip hemiarthroplasty   Risks and benefits of surgery, including, but not limited to dislocation, infection, bleeding, persistent pain, damage to surrounding structures, need for further surgery, malunion, nonunion and more severe complications associated with anesthesia were discussed.  All questions have been answered and they have elected to proceed with surgery.      Chief Complaint: Left hip pain  HPI: Dominique Farrell is a 87 y.o. female who presented to the ED for evaluation after sustaining a mechanical fall.  She states she returned from the grocery store and was putting her groceries away when she lost her balance and fell.  She noted immediate pain in her left hip.  No pain elsewhere.  She did not hit her head.  She is comfortable as long as she is not moving.  She has pain when she starts to move her leg.  No numbness or tingling.  Of note, she has a recent history of scabies and has lesions all over her body.   Past Medical History:  Diagnosis Date  . Hypercholesteremia   . Hypertension    Past Surgical History:  Procedure Laterality Date  . CATARACT EXTRACTION W/PHACO Left 03/26/2016   Procedure: CATARACT EXTRACTION PHACO AND  INTRAOCULAR LENS PLACEMENT (IOC);  Surgeon: Gemma Payor, MD;  Location: [facility];  Service: Ophthalmology;  Laterality: Left;  CDE 10.22  . CATARACT EXTRACTION W/PHACO Right 04/12/2016   Procedure: CATARACT EXTRACTION PHACO AND INTRAOCULAR LENS PLACEMENT (IOC);  Surgeon: Gemma Payor, MD;  Location: [facility];  Service: Ophthalmology;  Laterality: Right;  CDE 10.51   Social History   Socioeconomic History  . Marital status: Single    Spouse name: Not on file  . Number of children: Not on file  . Years of education: Not on file  . Highest education level: Not on file  Occupational History  . Not on file  Tobacco Use  . Smoking status: Never Smoker  . Smokeless tobacco: Never Used  Substance and Sexual Activity  . Alcohol use: No  . Drug use: No  . Sexual activity: Not on file  Other Topics Concern  . Not on file  Social History Narrative  . Not on file   Social Determinants of Health   Financial Resource Strain: Not on file  Food Insecurity: Not on file  Transportation Needs: Not on file  Physical Activity: Not on file  Stress: Not on file  Social Connections: Not on file   History reviewed. No pertinent family history. Allergies  Allergen Reactions  . Sulfa Antibiotics Nausea Only   Prior to Admission medications   Medication Sig Start Date End Date Taking? Authorizing Provider  ALLERGY RELIEF 180 MG tablet Take 180 mg by mouth daily. 03/28/21  Yes [provider]  amLODipine (NORVASC) 5 MG tablet Take 1 tablet  by mouth daily. 04/05/21  Yes [provider]  aspirin EC 81 MG tablet Take 81 mg by mouth daily.   Yes [provider]  atenolol (TENORMIN) 25 MG tablet Take 25 mg by mouth daily. 02/20/21  Yes [provider]  atorvastatin (LIPITOR) 20 MG tablet Take 1 tablet by mouth daily. 02/23/16  Yes [provider]  hydrochlorothiazide (HYDRODIURIL) 25 MG tablet Take 1 tablet by mouth daily. 02/20/21  Yes [provider]  rosuvastatin  (CRESTOR) 5 MG tablet Take 5 mg by mouth daily. 01/24/21  Yes [provider]  triamcinolone cream (KENALOG) 0.1 % Apply 1 application topically daily. 04/11/21  Yes [provider]   DG Chest Portable 1 View  Result Date: 04/17/2021 CLINICAL DATA:  [place], left hip pain and fracture EXAM: PORTABLE CHEST 1 VIEW COMPARISON:  04/13/2020 FINDINGS: 2 frontal views of the chest demonstrate an unremarkable cardiac silhouette. Stable atherosclerosis of the aortic arch. No airspace disease, effusion, or pneumothorax. No acute bony abnormalities. IMPRESSION: 1. Stable chest, no acute process. Electronically Signed   By: Randa Ngo M.D.   On: 04/17/2021 17:12   DG Hip Unilat W or Wo Pelvis 2-3 Views Left  Result Date: 04/17/2021 CLINICAL DATA:  Pain status post fall EXAM: DG HIP (WITH OR WITHOUT PELVIS) 2-3V LEFT COMPARISON:  None. FINDINGS: There is acute displaced transcervical fracture of the proximal left femur. No dislocation. There are degenerative changes of the hips. There is diffuse osteopenia. IMPRESSION: Acute displaced fracture of the proximal left femur. Electronically Signed   By: Constance Holster M.D.   On: 04/17/2021 16:55   Family History Reviewed and non-contributory, no pertinent history of problems with bleeding or anesthesia    Review of Systems No fevers or chills No numbness or tingling No chest pain No shortness of breath No bowel or bladder dysfunction No GI distress No headaches    OBJECTIVE  Vitals: Patient Vitals for the past 8 hrs:  BP Temp Temp src Pulse Resp SpO2  04/18/21 0448 (!) 159/61 97.7 F (36.5 C) Oral 76 18 91 %  04/18/21 0217 (!) 159/80 98.6 F (37 C) -- 80 18 91 %   General: Alert, no acute distress Cardiovascular: Extremities are warm Respiratory: No cyanosis, no use of accessory musculature Skin: Lesions from scabies all over her body in different stages of healing.   Neurologic: Sensation intact distally  Psychiatric:  Patient is competent for consent with normal mood and affect Lymphatic: No swelling obvious and reported other than the area involved in the exam below Extremities  LLE: Extremity held in a fixed position.  ROM deferred due to known fracture.  Sensation is intact distally in the sural, saphenous, DP, SP, and plantar nerve distribution. 2+ DP pulse.  Toes are WWP.  Active motion intact in the TA/EHL/GS. RLE: Sensation is intact distally in the sural, saphenous, DP, SP, and plantar nerve distribution. 2+ DP pulse.  Toes are WWP.  Active motion intact in the TA/EHL/GS. Tolerates gentle ROM of the hip.  No pain with axial loading.     Test Results Imaging XR of the Left hip demonstrates a Displaced femoral neck fracture.  Labs cbc Recent Labs    04/17/21 1614  WBC 15.5*  HGB 12.9  HCT 37.0  PLT 249    Labs inflam No results for input(s): CRP in the last 72 hours.  Invalid input(s): ESR  Labs coag Recent Labs    04/17/21 1614  INR 0.9  Recent Labs    04/17/21 1614 04/18/21 0417  NA 137 138  K 3.2* 4.1  CL 104 106  CO2 24 26  GLUCOSE 223* 131*  BUN 24* 21  CREATININE 0.95 0.91  CALCIUM 8.8* 8.8*

## 2021-04-18 NOTE — Anesthesia Preprocedure Evaluation (Addendum)
Anesthesia Evaluation  Patient identified by MRN, date of birth, ID band Patient awake    Reviewed: Allergy & Precautions, NPO status , Patient's Chart, lab work & pertinent test results  Airway Mallampati: II  TM Distance: >3 FB Neck ROM: Full    Dental  (+) Dental Advisory Given Crowns :   Pulmonary neg pulmonary ROS,  patient on 3 liters of O2, Saturations 89 to 92, lungs clear   Pulmonary exam normal breath sounds clear to auscultation       Cardiovascular Exercise Tolerance: Good hypertension, Pt. on medications Normal cardiovascular exam Rhythm:Regular Rate:Normal  17-Apr-2021 22:24:17 [facility] ROUTINE RECORD December 09, 1928 (57 yr) Female Caucasian Room:A310 Loc:903 Technician: Test ind:Pre-op Vent. rate 84 BPM PR interval 178 ms QRS duration 98 ms QT/QTcB 416/491 ms P-R-T axes 63 10 56 Sinus rhythm with Premature atrial complexes Prolonged QT Abnormal ECG   Neuro/Psych negative neurological ROS  negative psych ROS   GI/Hepatic negative GI ROS, Neg liver ROS,   Endo/Other  negative endocrine ROS  Renal/GU negative Renal ROS     Musculoskeletal negative musculoskeletal ROS (+)   Abdominal   Peds  Hematology negative hematology ROS (+)   Anesthesia Other Findings patient on 3 liters of O2, Saturations 89 to 92, lungs clear.  Reproductive/Obstetrics negative OB ROS                           Anesthesia Physical Anesthesia Plan  ASA: III  Anesthesia Plan: General   Post-op Pain Management:    Induction: Intravenous  PONV Risk Score and Plan: 4 or greater and Ondansetron and Dexamethasone  Airway Management Planned: Oral ETT  Additional Equipment:   Intra-op Plan:   Post-operative Plan: Extubation in OR  Informed Consent: I have reviewed the patients History and Physical, chart, labs and discussed the procedure including the risks, benefits and  alternatives for the proposed anesthesia with the patient or authorized representative who has indicated his/her understanding and acceptance.     Dental advisory given  Plan Discussed with: CRNA and Surgeon  Anesthesia Plan Comments:       Anesthesia Quick Evaluation

## 2021-04-18 NOTE — Op Note (Signed)
Orthopaedic Surgery Operative Note (CSN: [id number])  Dominique Farrell  1928-09-26 Date of Surgery: 04/18/2021   Diagnoses:  Displaced left femoral neck fracture  Procedure: Cemented left hip hemiarthroplasty   Operative Finding Successful completion of the planned procedure.  Placement of left hip hemiarthroplasty for a displaced femoral neck fracture    Post-Op Diagnosis: Same Surgeons:Primary: Mordecai Rasmussen, MD Assistants: Marquita Palms Location: [facility] OR ROOM 3 Anesthesia: General with local anesthesia Antibiotics: Ancef 2 g with local vancomycin powder 1 g at the surgical site Tourniquet time: N/A Estimated Blood Loss: 254 cc Complications: None Specimens: None Implants: Implant Name Type Inv. Item Serial No. Manufacturer Lot No. LRB No. Used Action  CEMENT HV SMART SET - [id number] Cement CEMENT HV SMART SET  DEPUY ORTH[employer] [id number] Left 2 Implanted  HIP BALL ARTICU DEPUY - [id number] Hips HIP BALL ARTICU DEPUY  DEPUY ORTHOPAEDICS [id number] Left 1 Implanted  RESTRICTOR CEMENT PE SZ 2 - [id number] Cement RESTRICTOR CEMENT PE SZ 2  DEPUY ORTHOPA[employer] [id number] Left 1 Implanted  STEM SUMMIT CEMENT BASIC SZ4 - [id number] Hips STEM SUMMIT CEMENT BASIC SZ4  DEPUY ORTHOPAEDICS [id number] Left 1 Implanted  CENTRALIZER STEM HIP 12MM - [id number] Hips CENTRALIZER STEM HIP 12MM  DEPUY ORTHOPAEDICS [id number] Left 1 Implanted  BIPOLAR PROS AML 46MM - [id number] Hips BIPOLAR PROS AML 46MM  DEPUY ORTHOPAEDICS [id number] Left 1 Implanted    Indications for Surgery:   Dominique Farrell is a 87 y.o. female who sustained a left displaced femoral neck fracture after a mechanical fall.  She was admitted to the medicine service and deemed well enough to go to surgery.  Benefits and risks of operative and nonoperative management were discussed prior to surgery with the patient and informed consent form was completed.  Specific risks including infection, need for additional surgery, bleeding, pain, poor healing, dislocation  and more severe complications associated with anesthesia.  She has elected to proceed.    Procedure:   The patient was identified properly. Informed consent was obtained and the surgical site was marked. The patient was taken to the OR where general anesthesia was induced.  The patient was positioned lateral, with the left hip up.  The left leg was prepped and draped in the usual sterile fashion.  Timeout was performed before the beginning of the case.  Ancef dosing was confirmed prior to incision.  Patient received 1 g of TXA before the start of the case  We started by making an incision centered over the greater trochanter with a scalpel. We used the scalpel to continue to dissect to the fascia.  Electrocautery was used to help with hemostasis.  A cobb elevator was used to clear the IT band and then it was pierced with Bovie electrocautery. Mayo scissors were used to cut the fascia in a longitudinal fashion. The gluteus maximus fibers were bluntly split in line with their fibers.   The femur was slowly internally rotated, putting tension on the posterior structures. The short external rotators were identified and tagged with #1 vicryl.  Bovie electrocautery was used to dissect the short external rotators off of the insertion onto the femur.  Next, we identified the capsule and made a T capsulotomy.  The capsule was then tagged with a #1 Vicryl suture.   Next, we visualized the femoral neck fracture. We identified the sciatic nerve by palpation and verified that it was not in danger during the dissection.    We then made a neck cut approximately 1.5 cm above the  lesser trochanter with a saw. We removed the femoral head. We used the box cutter to cut away some of the greater trochanter for ease of insertion of the stem. We then used the canal finder to locate the femoral canal.   Using the angle of the femoral neck as our guide for version, we broached sequentially. We trialed components and found  appropriate fit and stability.  The patient would come to full extension of the hip. The hip was stable at 90 degrees of flexion, and 45 degrees of internal rotation.  Leg lengths were approximately equal.  We then removed the trials, as well as the broach. We ensured there were no foreign bodies or bone within the acetabulum. We copiously irrigated the wound.  We measured and placed an appropriately sized cement restrictor within the canal.  Cement was mixed on the back table and when ready, cement was inserted into the canal and pressurized.  We then carefully placed our stem.  Excess cement was removed immediately.  We maintained pressure on the stem while the cement hardened.  An appropriately sized head was placed on to the stem and the hip was reduced. Again, we noted the hip was stable in the previously mentioned manipulations.   We repaired the posterior capsule.  Short external rotators were repaired to the greater trochanter through bone tunnels. We closed the fascia of the iliotibial band and gluteus maximus with running and intterupted Vicryl sutures. We then closed Scarpa's fascia with Vicryl sutures. Skin was closed with 2-0 monocryl and 4-0 Monocryl with Steri-Strips and an Aquacel dressing was placed.  A hib abduction pillow was secured.  Patient was awoken taken to PACU in stable condition.   Post-operative plan:  The patient will be WBAT on the operative extremity. Hip abduction pillow in place at all times when in bed Posterior hip precautions PT/OT    DVT prophylaxis: Aspirin 81 mg twice daily for 6 weeks.    Pain control with PRN pain medication preferring oral medicines.   Follow up plan will be scheduled in approximately 14 days for incision check and XR.

## 2021-04-18 NOTE — Progress Notes (Signed)
Patient removed abduction pillow. Refusing to stay still. Patient trying to get out of bed. Patient is confused. Patient is noncompliant. MD Zierle-Ghosh made aware.

## 2021-04-19 LAB — CBC
HCT: 33.3 % — ABNORMAL LOW (ref 36.0–46.0)
Hemoglobin: 11 g/dL — ABNORMAL LOW (ref 12.0–15.0)
MCH: 31.3 pg (ref 26.0–34.0)
MCHC: 33 g/dL (ref 30.0–36.0)
MCV: 94.9 fL (ref 80.0–100.0)
Platelets: 188 10*3/uL (ref 150–400)
RBC: 3.51 MIL/uL — ABNORMAL LOW (ref 3.87–5.11)
RDW: 13.7 % (ref 11.5–15.5)
WBC: 15.4 10*3/uL — ABNORMAL HIGH (ref 4.0–10.5)
nRBC: 0 % (ref 0.0–0.2)

## 2021-04-19 MED ORDER — ZIPRASIDONE MESYLATE 20 MG IM SOLR
10.0000 mg | Freq: Once | INTRAMUSCULAR | Status: DC
  Filled 2021-04-19: qty 20

## 2021-04-19 MED ORDER — ATORVASTATIN CALCIUM 20 MG PO TABS
20.0000 mg | ORAL_TABLET | Freq: Every day | ORAL | Status: DC
  Filled 2021-04-19 (×2): qty 1

## 2021-04-19 NOTE — Progress Notes (Signed)
Patient is calm and cooperative with family member in the room. Restraints are no longer on patient. MD notified.

## 2021-04-19 NOTE — Evaluation (Signed)
Occupational Therapy Evaluation Patient Details Name: Dominique Farrell MRN: [id number] DOB: [birth year] Today's Date: 04/19/2021    History of Present Illness Dominique Farrell is a 87 y.o. female s/p Cemented left hip hemiarthroplasty on 04/18/21 with medical history significant for hypertension, dyslipidemia.  Patient presented to the ED with reports of a fall.  Patient reports she had just finished putting stress away, when she turned suddenly on her leg, lost her balance and fell on her left side.  She did not hit her head.  She did not lose consciousness.  She denies frequent falls.  No cardiac or lung history.   Clinical Impression   Pt agreeable to OT/PT co-evaluation. Pt mildly agitated at start of session with improvement as session progressed. Pt requires total assist to don socks supine in bed. Verbal cuing needed to maintain posterior hip precautions. Mod A required for sit to stand and functional mobility with RW. Pt demonstrates general UE weakness. Pt left sitting upright in chair with adduction pillow in place. Pt able to open container of utensils prior to feeding with SPV. Pt will benefit from continued OT in the hospital and recommended venue below to increase strength, balance, and endurance for safe ADL's.   Follow Up Recommendations  SNF    Equipment Recommendations  None recommended by OT           Precautions / Restrictions Precautions Precautions: Fall;Posterior Hip Required Braces or Orthoses: Other Brace Other Brace: Abduction pillow when in bed, sitting Restrictions Weight Bearing Restrictions: Yes LLE Weight Bearing: Weight bearing as tolerated      Mobility Bed Mobility Overal bed mobility: Needs Assistance Bed Mobility: Supine to Sit     Supine to sit: Mod assist     General bed mobility comments: increased time, slow labored movement    Transfers Overall transfer level: Needs assistance Equipment used: Rolling walker (2 wheeled) Transfers: Audiological scientist;Sit to/from Stand Sit to Stand: Mod assist Stand pivot transfers: Mod assist       General transfer comment: increased time, labored movement    Balance Overall balance assessment: Needs assistance Sitting-balance support: Feet supported;No upper extremity supported Sitting balance-Leahy Scale: Fair Sitting balance - Comments: fair/good seated at EOB   Standing balance support: During functional activity;Bilateral upper extremity supported Standing balance-Leahy Scale: Fair Standing balance comment: using RW                           ADL either performed or assessed with clinical judgement   ADL Overall ADL's : Needs assistance/impaired                     Lower Body Dressing: Total assistance;Bed level Lower Body Dressing Details (indicate cue type and reason): donning socks at bed level Toilet Transfer: Moderate assistance;RW;Stand-pivot Toilet Transfer Details (indicate cue type and reason): simulated via EOB to chair transfer with RW                 Vision Baseline Vision/History: Wears glasses Wears Glasses: At all times                  Pertinent Vitals/Pain Pain Assessment: Faces Faces Pain Scale: Hurts even more Pain Location: left hip with movement Pain Descriptors / Indicators: Sore;Grimacing Pain Intervention(s): Limited activity within patient's tolerance;Monitored during session;Repositioned     Hand Dominance Right   Extremity/Trunk Assessment Upper Extremity Assessment Upper Extremity Assessment: Generalized weakness   Lower Extremity Assessment  Lower Extremity Assessment: Defer to PT evaluation LLE Deficits / Details: grossly 3/5 LLE: Unable to fully assess due to pain LLE Sensation: WNL LLE Coordination: WNL   Cervical / Trunk Assessment Cervical / Trunk Assessment: Normal   Communication Communication Communication: No difficulties   Cognition Arousal/Alertness: Awake/alert Behavior During Therapy:  WFL for tasks assessed/performed Overall Cognitive Status: Impaired/Different from baseline Area of Impairment: Awareness                               General Comments: possbly confused due anesthesia                    Home Living Family/patient expects to be discharged to:: Private residence Living Arrangements: Other relatives Available Help at Discharge: Family;Available PRN/intermittently Type of Home: House Home Access: Stairs to enter [employer] of Steps: 2-4 Entrance Stairs-Rails: Right;Left (can't reach both) Home Layout: One level     Bathroom Shower/Tub: Occupational psychologist: Handicapped height Bathroom Accessibility: Yes   Home Equipment: Walker - 2 wheels;Grab bars - tub/shower;Shower seat;Bedside commode          Prior Functioning/Environment Level of Independence: Independent        Comments: community ambulator, drives        OT Problem List: Decreased strength;Decreased range of motion;Decreased activity tolerance;Impaired balance (sitting and/or standing);Decreased coordination;Decreased safety awareness;Decreased knowledge of use of DME or AE;Decreased knowledge of precautions      OT Treatment/Interventions: Self-care/ADL training;Therapeutic exercise;Therapeutic activities;Balance training;Patient/family education    OT Goals(Current goals can be found in the care plan section) Acute Rehab OT Goals Patient Stated Goal: return home with family to assist OT Goal Formulation: With patient/family Time For Goal Achievement: 05/03/21 Potential to Achieve Goals: Fair  OT Frequency: Min 2X/week               Co-evaluation PT/OT/SLP Co-Evaluation/Treatment: Yes Reason for Co-Treatment: To address functional/ADL transfers PT goals addressed during session: Mobility/safety with mobility;Balance;Proper use of DME OT goals addressed during session: ADL's and self-care;Strengthening/ROM                        End of Session Equipment Utilized During Treatment: Rolling walker  Activity Tolerance: Patient tolerated treatment well Patient left: in chair;with call bell/phone within reach;with chair alarm set;with family/visitor present  OT Visit Diagnosis: Unsteadiness on feet (R26.81);Muscle weakness (generalized) (M62.81);History of falling (Z91.81);Other symptoms and signs involving cognitive function                Time: 9937-1696 OT Time Calculation (min): 26 min Charges:  OT General Charges $OT Visit: 1 Visit OT Evaluation $OT Eval Low Complexity: 1 Low  Tonantzin Mimnaugh OT, MOT   Larey Seat 04/19/2021, 2:22 PM

## 2021-04-19 NOTE — Progress Notes (Signed)
Patient is calm and cooperative. Patient is sitting up in recliner eating lunch. Chair alarm on and call bell in reach.

## 2021-04-19 NOTE — Evaluation (Signed)
Physical Therapy Evaluation Patient Details Name: Dominique Farrell MRN: [id number] DOB: [birth year] Today's Date: 04/19/2021   History of Present Illness  Dominique Farrell is a 87 y.o. female s/p Cemented left hip hemiarthroplasty on 04/18/21 with medical history significant for hypertension, dyslipidemia.  Patient presented to the ED with reports of a fall.  Patient reports she had just finished putting stress away, when she turned suddenly on her leg, lost her balance and fell on her left side.  She did not hit her head.  She did not lose consciousness.  She denies frequent falls.  No cardiac or lung history.    Clinical Impression  Patient demonstrates slow labored movement for sitting up at bedside with fair/good return for using BUE to help scoot self forward, unsteady on feet with frequent buckling of LLE during ambulation secondary to increased pain and weakness and severe fall risk.  Patient tolerated sitting up in chair with abduction pillow between legs after therapy with her family member present - nursing staff notified.  Patient will benefit from continued physical therapy in hospital and recommended venue below to increase strength, balance, endurance for safe ADLs and gait.     Follow Up Recommendations SNF    Equipment Recommendations  None recommended by PT    Recommendations for Other Services       Precautions / Restrictions Precautions Precautions: Fall;Posterior Hip Required Braces or Orthoses: Other Brace Other Brace: Abduction pillow when in bed, sitting Restrictions Weight Bearing Restrictions: Yes LLE Weight Bearing: Weight bearing as tolerated      Mobility  Bed Mobility Overal bed mobility: Needs Assistance Bed Mobility: Supine to Sit     Supine to sit: Mod assist     General bed mobility comments: increased time, slow labored movement    Transfers Overall transfer level: Needs assistance   Transfers: Stand Pivot Transfers;Sit to/from Stand Sit to  Stand: Mod assist Stand pivot transfers: Mod assist       General transfer comment: increased time, labored movement  Ambulation/Gait Ambulation/Gait assistance: Mod assist Gait Distance (Feet): 15 Feet Assistive device: Rolling walker (2 wheeled) Gait Pattern/deviations: Decreased step length - right;Decreased step length - left;Decreased stride length;Decreased stance time - left;Antalgic Gait velocity: decreased   General Gait Details: slow labored cadence with frequent buckling of left knee due to increased pain/weakness  Stairs            Wheelchair Mobility    Modified Rankin (Stroke Patients Only)       Balance Overall balance assessment: Needs assistance Sitting-balance support: Feet supported;No upper extremity supported Sitting balance-Leahy Scale: Fair Sitting balance - Comments: fair/good seated at EOB   Standing balance support: During functional activity;Bilateral upper extremity supported Standing balance-Leahy Scale: Fair Standing balance comment: using RW                             Pertinent Vitals/Pain Pain Assessment: Faces Faces Pain Scale: Hurts even more Pain Location: left hip with movement Pain Descriptors / Indicators: Sore;Grimacing Pain Intervention(s): Limited activity within patient's tolerance;Monitored during session;Repositioned    Home Living Family/patient expects to be discharged to:: Private residence Living Arrangements: Other relatives Available Help at Discharge: Family;Available PRN/intermittently Type of Home: House Home Access: Stairs to enter Entrance Stairs-Rails: Right;Left (to wide to reach both) Entrance Stairs-Number of Steps: 2-4 Home Layout: One level Home Equipment: Walker - 2 wheels;Grab bars - tub/shower;Shower seat;Bedside commode      Prior Function Level  of Independence: Independent         Comments: community ambulator, drives     Journalist, newspaper        Extremity/Trunk  Assessment   Upper Extremity Assessment Upper Extremity Assessment: Defer to OT evaluation    Lower Extremity Assessment Lower Extremity Assessment: Generalized weakness;LLE deficits/detail LLE Deficits / Details: grossly 3/5 LLE: Unable to fully assess due to pain LLE Sensation: WNL LLE Coordination: WNL    Cervical / Trunk Assessment Cervical / Trunk Assessment: Normal  Communication   Communication: No difficulties  Cognition Arousal/Alertness: Awake/alert Behavior During Therapy: WFL for tasks assessed/performed Overall Cognitive Status: Impaired/Different from baseline Area of Impairment: Awareness                               General Comments: possbly confused due anesthesia      General Comments      Exercises     Assessment/Plan    PT Assessment Patient needs continued PT services  PT Problem List Decreased strength;Decreased activity tolerance;Decreased balance;Decreased mobility       PT Treatment Interventions DME instruction;Gait training;Stair training;Functional mobility training;Therapeutic activities;Therapeutic exercise;Balance training    PT Goals (Current goals can be found in the Care Plan section)  Acute Rehab PT Goals Patient Stated Goal: return home with family to assist PT Goal Formulation: With patient/family Time For Goal Achievement: 05/03/21 Potential to Achieve Goals: Good    Frequency Min 4X/week   Barriers to discharge        Co-evaluation PT/OT/SLP Co-Evaluation/Treatment: Yes Reason for Co-Treatment: To address functional/ADL transfers PT goals addressed during session: Mobility/safety with mobility;Balance;Proper use of DME         AM-PAC PT "6 Clicks" Mobility  Outcome Measure Help needed turning from your back to your side while in a flat bed without using bedrails?: A Lot Help needed moving from lying on your back to sitting on the side of a flat bed without using bedrails?: A Lot Help needed moving  to and from a bed to a chair (including a wheelchair)?: A Lot Help needed standing up from a chair using your arms (e.g., wheelchair or bedside chair)?: A Lot Help needed to walk in hospital room?: A Lot Help needed climbing 3-5 steps with a railing? : Total 6 Click Score: 11    End of Session   Activity Tolerance: Patient tolerated treatment well;Patient limited by fatigue;Patient limited by pain Patient left: in chair;with call bell/phone within reach;with family/visitor present Nurse Communication: Mobility status PT Visit Diagnosis: Unsteadiness on feet (R26.81);Other abnormalities of gait and mobility (R26.89);Muscle weakness (generalized) (M62.81)    Time: 2423-5361 PT Time Calculation (min) (ACUTE ONLY): 31 min   Charges:   PT Evaluation $PT Eval Moderate Complexity: 1 Mod PT Treatments $Therapeutic Activity: 23-37 mins        12:38 PM, 04/19/21 Lonell Grandchild, MPT Physical Therapist with [facility] 336 [phone] office [phone] mobile phone

## 2021-04-19 NOTE — Addendum Note (Signed)
Addendum  created 04/19/21 1350 by Jonna Munro, CRNA   Charge Capture section accepted

## 2021-04-19 NOTE — Progress Notes (Signed)
Tried to contact patient family member Delcie Roch but no answer at this time and unable to leave a voicemail.

## 2021-04-19 NOTE — Progress Notes (Signed)
Foley removed at [phone]. Patient due to void by lunch time.

## 2021-04-19 NOTE — Plan of Care (Signed)
  Problem: Acute Rehab PT Goals(only PT should resolve) Goal: Pt Will Go Supine/Side To Sit Outcome: Progressing Flowsheets (Taken 04/19/2021 1351) Pt will go Supine/Side to Sit:  with minimal assist  with moderate assist Goal: Patient Will Transfer Sit To/From Stand Outcome: Progressing Flowsheets (Taken 04/19/2021 1351) Patient will transfer sit to/from stand: with minimal assist Goal: Pt Will Transfer Bed To Chair/Chair To Bed Outcome: Progressing Flowsheets (Taken 04/19/2021 1351) Pt will Transfer Bed to Chair/Chair to Bed:  with min assist  with mod assist Goal: Pt Will Ambulate Outcome: Progressing Flowsheets (Taken 04/19/2021 1351) Pt will Ambulate:  50 feet  with minimal assist  with rolling walker   1:52 PM, 04/19/21 Lonell Grandchild, MPT Physical Therapist with [facility] 336 [phone] office [phone] mobile phone

## 2021-04-19 NOTE — Progress Notes (Signed)
   ORTHOPAEDIC PROGRESS NOTE  s/p Procedure(s): ARTHROPLASTY BIPOLAR HIP (HEMIARTHROPLASTY); left hip  DOS: 04/18/21  SUBJECTIVE: Patient had a rough night.  She was disoriented, removed her surgical dressing and was combative.  Required soft restraints.  She was paranoid and concerned that others were trying to hurt her while in the hospital.   OBJECTIVE: PE:  Confused.  Talkative but not coherent.  Hip abduction pillow in place.  Dressing in place.  Clean, dry and intact.  Toes are warm and well perfused.  Active motion intact to the EHL/TA   Vitals:   04/19/21 0132 04/19/21 0544  BP: (!) 174/74 (!) 165/77  Pulse: 96 77  Resp: 17 19  Temp: 98.6 F (37 C) 98.6 F (37 C)  SpO2: 95% 99%     ASSESSMENT: Dominique Farrell is a 87 y.o. female doing well postoperatively.  PLAN: Weightbearing: WBAT LLE ; Posterior hip precautions.  Insicional and dressing care: Reinforce dressings as needed Orthopedic device(s): Hip abduction pillow VTE prophylaxis: Aspirin 81mg  BID x 6 weeks Pain control: PRN pain medications.  Avoid sedating medications if possible Follow - up plan: 2 weeks   Contact information:     Saleema Weppler A. Amedeo Kinsman, MD [place] [address] [place],  [place]  [address] Phone: [phone] Fax: [phone]

## 2021-04-19 NOTE — Progress Notes (Signed)
Walked in patient room to her trying to get out of bed. Patient was danger to self and others. Patient was combative. Patient ripped off incision dressing, ripped out IV. Incision redressed. Patient was confused and non-directable. MD Zierle-Ghosh notified. Soft wrist L and R restraint applied at this time. Nasal cannula put back on patient and abduction pillow back in place. New IV in L hand placed. Will continue to monitor.

## 2021-04-19 NOTE — Progress Notes (Signed)
PROGRESS NOTE  Dominique Farrell ZOX:[id number] DOB: 27-Jul-1928 DOA: 04/17/2021 PCP: Monico Blitz, MD  Brief History:  87 y.o.femalewith medical history significant forhypertension, dyslipidemia. Patient presented to the ED with reports of a accidental fall, sustaining a left hip injury, fracture.   ED Course:Stable vitals. Acute displaced fracture of the proximal left femur. Portable chest x-ray without acute abnormality. ED provider talked to orthopedist Dr. Amedeo Kinsman, okay to admit here, will see patient in consult.  She underwent left hemiarthroplasty on 04/18/21.  PT was consulted post op and recommended SNF.  Assessment/Plan: Closed left proximal femur fracture -status post mechanical fall.Pelvicx-ray shows acute displaced fracture of the proximal left femur. Chest x-ray without acute abnormality. -Judicious opioids -04/18/21--left hemiarthroplasty -PT>>SNF -ASA 81 mg bid per ortho  Postop delirum -due to anesthesia -suspect component of sundowning -04/19/21--improved  Hypokalemia - k 3.2, 4.1 -Repleted  Hypertension -remained stable -Hold lisinopril HCTZ for now whilehydrating  Dyslipidemia-  -Resume atorvastatin -postop  Chronic lower extremity swelling- R > L,chronic and unchanged. [facility] everywhere DVT studies 10/2020 negative bilaterally. Continue to monitor     Status is: Inpatient  Remains inpatient appropriate because:Inpatient level of care appropriate due to severity of illness   Dispo: The patient is from: Home              Anticipated d/c is to: SNF              Patient currently is not medically stable to d/c.   Difficult to place patient No        Family Communication:   No Family at bedside  Consultants:  ortho  Code Status:  FULL   DVT Prophylaxis:  ASA bid   Procedures: As Listed in Progress Note Above  Antibiotics: None       Subjective:  Patient denies fevers, chills, headache, chest pain,  dyspnea, nausea, vomiting, diarrhea, abdominal pain, dysuria, hematuria, hematochezia, and melena.  Objective: Vitals:   04/18/21 2105 04/19/21 0132 04/19/21 0544 04/19/21 1314  BP: (!) 167/77 (!) 174/74 (!) 165/77 (!) 139/58  Pulse: 94 96 77 87  Resp: 18 17 19 17   Temp: 98.4 F (36.9 C) 98.6 F (37 C) 98.6 F (37 C) 97.8 F (36.6 C)  TempSrc: Oral   Oral  SpO2: (!) 86% 95% 99% 96%  Weight:      Height:        Intake/Output Summary (Last 24 hours) at 04/19/2021 1753 Last data filed at 04/19/2021 1500 Gross per 24 hour  Intake 577.93 ml  Output 975 ml  Net -397.07 ml   Weight change:  Exam:   General:  Pt is alert, follows commands appropriately, not in acute distress  HEENT: No icterus, No thrush, No neck mass, [place]/AT  Cardiovascular: RRR, S1/S2, no rubs, no gallops  Respiratory: CTA bilaterally, no wheezing, no crackles, no rhonchi  Abdomen: Soft/+BS, non tender, non distended, no guarding  Extremities: No edema, No lymphangitis, No petechiae, No rashes, no synovitis   Data Reviewed: I have personally reviewed following labs and imaging studies Basic Metabolic Panel: Recent Labs  Lab 04/17/21 1614 04/18/21 0417  NA 137 138  K 3.2* 4.1  CL 104 106  CO2 24 26  GLUCOSE 223* 131*  BUN 24* 21  CREATININE 0.95 0.91  CALCIUM 8.8* 8.8*   Liver Function Tests: Recent Labs  Lab 04/17/21 1614  AST 26  ALT 23  ALKPHOS 47  BILITOT 0.7  PROT 6.5  ALBUMIN 3.6   No results for input(s): LIPASE, AMYLASE in the last 168 hours. No results for input(s): AMMONIA in the last 168 hours. Coagulation Profile: Recent Labs  Lab 04/17/21 1614  INR 0.9   CBC: Recent Labs  Lab 04/17/21 1614 04/19/21 0415  WBC 15.5* 15.4*  NEUTROABS 12.7*  --   HGB 12.9 11.0*  HCT 37.0 33.3*  MCV 93.4 94.9  PLT 249 188   Cardiac Enzymes: No results for input(s): CKTOTAL, CKMB, CKMBINDEX, TROPONINI in the last 168 hours. BNP: Invalid input(s): POCBNP CBG: No results for  input(s): GLUCAP in the last 168 hours. HbA1C: No results for input(s): HGBA1C in the last 72 hours. Urine analysis: No results found for: COLORURINE, APPEARANCEUR, LABSPEC, PHURINE, GLUCOSEU, HGBUR, BILIRUBINUR, KETONESUR, PROTEINUR, UROBILINOGEN, NITRITE, LEUKOCYTESUR Sepsis Labs: @LABRCNTIP (procalcitonin:4,lacticidven:4) ) Recent Results (from the past 240 hour(s))  Resp Panel by RT-PCR (Flu A&B, Covid) Nasopharyngeal Swab     Status: None   Collection Time: 04/17/21  3:40 PM   Specimen: Nasopharyngeal Swab; Nasopharyngeal(NP) swabs in vial transport medium  Result Value Ref Range Status   SARS Coronavirus 2 by RT PCR NEGATIVE NEGATIVE Final    Comment: (NOTE) SARS-CoV-2 target nucleic acids are NOT DETECTED.  The SARS-CoV-2 RNA is generally detectable in upper respiratory specimens during the acute phase of infection. The lowest concentration of SARS-CoV-2 viral copies this assay can detect is 138 copies/mL. A negative result does not preclude SARS-Cov-2 infection and should not be used as the sole basis for treatment or other patient management decisions. A negative result may occur with  improper specimen collection/handling, submission of specimen other than nasopharyngeal swab, presence of viral mutation(s) within the areas targeted by this assay, and inadequate number of viral copies(<138 copies/mL). A negative result must be combined with clinical observations, patient history, and epidemiological information. The expected result is Negative.  Fact Sheet for Patients:  EntrepreneurPulse.com.au  Fact Sheet for Healthcare Providers:  IncredibleEmployment.be  This test is no t yet approved or cleared by the [place] FDA and  has been authorized for detection and/or diagnosis of SARS-CoV-2 by FDA under an Emergency Use Authorization (EUA). This EUA will remain  in effect (meaning this test can be used) for the duration of  the COVID-19 declaration under Section 564(b)(1) of the Act, 21 U.S.C.section 360bbb-3(b)(1), unless the authorization is terminated  or revoked sooner.       Influenza A by PCR NEGATIVE NEGATIVE Final   Influenza B by PCR NEGATIVE NEGATIVE Final    Comment: (NOTE) The Xpert Xpress SARS-CoV-2/FLU/RSV plus assay is intended as an aid in the diagnosis of influenza from Nasopharyngeal swab specimens and should not be used as a sole basis for treatment. Nasal washings and aspirates are unacceptable for Xpert Xpress SARS-CoV-2/FLU/RSV testing.  Fact Sheet for Patients: EntrepreneurPulse.com.au  Fact Sheet for Healthcare Providers: IncredibleEmployment.be  This test is not yet approved or cleared by the [place] FDA and has been authorized for detection and/or diagnosis of SARS-CoV-2 by FDA under an Emergency Use Authorization (EUA). This EUA will remain in effect (meaning this test can be used) for the duration of the COVID-19 declaration under Section 564(b)(1) of the Act, 21 U.S.C. section 360bbb-3(b)(1), unless the authorization is terminated or revoked.  Performed at [facility], [address], [place],  [address]   Surgical pcr screen     Status: Abnormal   Collection Time: 04/18/21  8:09 AM   Specimen: Nasal Mucosa; Nasal Swab  Result Value Ref Range  Status   MRSA, PCR POSITIVE (A) NEGATIVE Final    Comment: CRITICAL RESULT CALLED TO, READ BACK BY AND VERIFIED WITH: TORI LEONARD 04/18/21 @ 1043 BY S BEARD    Staphylococcus aureus POSITIVE (A) NEGATIVE Final    Comment: (NOTE) The Xpert SA Assay (FDA approved for NASAL specimens in patients 69 years of age and older), is one component of a comprehensive surveillance program. It is not intended to diagnose infection nor to guide or monitor treatment. Performed at [facility], [address], [place], [place] [address]      Scheduled Meds: . aspirin  81 mg Oral BID  .  Chlorhexidine Gluconate Cloth  6 each Topical Q0600  . mupirocin ointment  1 application Nasal BID  . ziprasidone  10 mg Intramuscular Once   Continuous Infusions:  Procedures/Studies: DG Chest Portable 1 View  Result Date: 04/17/2021 CLINICAL DATA:  [place], left hip pain and fracture EXAM: PORTABLE CHEST 1 VIEW COMPARISON:  04/13/2020 FINDINGS: 2 frontal views of the chest demonstrate an unremarkable cardiac silhouette. Stable atherosclerosis of the aortic arch. No airspace disease, effusion, or pneumothorax. No acute bony abnormalities. IMPRESSION: 1. Stable chest, no acute process. Electronically Signed   By: Randa Ngo M.D.   On: 04/17/2021 17:12   DG HIP UNILAT WITH PELVIS 2-3 VIEWS LEFT  Result Date: 04/18/2021 CLINICAL DATA:  Postop EXAM: DG HIP (WITH OR WITHOUT PELVIS) 2-3V LEFT COMPARISON:  04/17/2021 FINDINGS: SI joints are non widened. The pubic symphysis appears intact. Interval left hip hemiarthroplasty with intact hardware and normal alignment. Gas in the soft tissues consistent with recent surgery IMPRESSION: Interval left hip hemiarthroplasty with expected surgical changes Electronically Signed   By: Donavan Foil M.D.   On: 04/18/2021 17:57   DG Hip Unilat W or Wo Pelvis 2-3 Views Left  Result Date: 04/17/2021 CLINICAL DATA:  Pain status post fall EXAM: DG HIP (WITH OR WITHOUT PELVIS) 2-3V LEFT COMPARISON:  None. FINDINGS: There is acute displaced transcervical fracture of the proximal left femur. No dislocation. There are degenerative changes of the hips. There is diffuse osteopenia. IMPRESSION: Acute displaced fracture of the proximal left femur. Electronically Signed   By: Constance Holster M.D.   On: 04/17/2021 16:55    Orson Eva, DO  [facility]  If 7PM-7AM, please contact night-coverage [email or web address] Password [email or web address] 04/19/2021, 5:53 PM   LOS: 2 days

## 2021-04-19 NOTE — Plan of Care (Signed)
  Problem: Acute Rehab OT Goals (only OT should resolve) Goal: Pt. Will Perform Grooming Flowsheets (Taken 04/19/2021 1426) Pt Will Perform Grooming:  with min assist  standing Goal: Pt. Will Perform Upper Body Dressing Flowsheets (Taken 04/19/2021 1426) Pt Will Perform Upper Body Dressing:  with modified independence  sitting Goal: Pt. Will Perform Lower Body Dressing Flowsheets (Taken 04/19/2021 1426) Pt Will Perform Lower Body Dressing:  with min guard assist  with min assist  with adaptive equipment  sitting/lateral leans  sit to/from stand Goal: Pt. Will Transfer To Toilet Flowsheets (Taken 04/19/2021 1426) Pt Will Transfer to Toilet:  with min assist  with min guard assist  stand pivot transfer  grab bars Goal: Pt/Caregiver Will Perform Home Exercise Program Flowsheets (Taken 04/19/2021 1426) Pt/caregiver will Perform Home Exercise Program:  Increased strength  Both right and left upper extremity  With Supervision  Twanda Stakes OT, MOT

## 2021-04-19 NOTE — Progress Notes (Signed)
Attempted to contact patient family member Dominique Farrell again but no answer and unable to leave a voicemail.

## 2021-04-20 ENCOUNTER — Inpatient Hospital Stay
Admission: RE | Admit: 2021-04-20 | Discharge: 2021-04-21 | Disposition: A | Discharge status: Home / Self Care | Payer: Medicare Other | Source: Ambulatory Visit | Attending: Internal Medicine | Admitting: Internal Medicine

## 2021-04-20 ENCOUNTER — Encounter (HOSPITAL_COMMUNITY): Payer: Self-pay | Admitting: Orthopedic Surgery

## 2021-04-20 DIAGNOSIS — I10 Essential (primary) hypertension: Secondary | ICD-10-CM | POA: Diagnosis not present

## 2021-04-20 DIAGNOSIS — S72002D Fracture of unspecified part of neck of left femur, subsequent encounter for closed fracture with routine healing: Secondary | ICD-10-CM | POA: Diagnosis not present

## 2021-04-20 DIAGNOSIS — Z961 Presence of intraocular lens: Secondary | ICD-10-CM | POA: Diagnosis not present

## 2021-04-20 DIAGNOSIS — R410 Disorientation, unspecified: Secondary | ICD-10-CM | POA: Diagnosis not present

## 2021-04-20 DIAGNOSIS — Z96642 Presence of left artificial hip joint: Secondary | ICD-10-CM | POA: Diagnosis not present

## 2021-04-20 DIAGNOSIS — E785 Hyperlipidemia, unspecified: Secondary | ICD-10-CM | POA: Diagnosis not present

## 2021-04-20 DIAGNOSIS — R600 Localized edema: Secondary | ICD-10-CM | POA: Diagnosis not present

## 2021-04-20 DIAGNOSIS — S72002A Fracture of unspecified part of neck of left femur, initial encounter for closed fracture: Secondary | ICD-10-CM | POA: Diagnosis not present

## 2021-04-20 DIAGNOSIS — R262 Difficulty in walking, not elsewhere classified: Secondary | ICD-10-CM | POA: Diagnosis not present

## 2021-04-20 DIAGNOSIS — M6281 Muscle weakness (generalized): Secondary | ICD-10-CM | POA: Diagnosis not present

## 2021-04-20 DIAGNOSIS — Z9181 History of falling: Secondary | ICD-10-CM | POA: Diagnosis not present

## 2021-04-20 LAB — CBC
HCT: 31.2 % — ABNORMAL LOW (ref 36.0–46.0)
Hemoglobin: 10.5 g/dL — ABNORMAL LOW (ref 12.0–15.0)
MCH: 32.4 pg (ref 26.0–34.0)
MCHC: 33.7 g/dL (ref 30.0–36.0)
MCV: 96.3 fL (ref 80.0–100.0)
Platelets: 185 10*3/uL (ref 150–400)
RBC: 3.24 MIL/uL — ABNORMAL LOW (ref 3.87–5.11)
RDW: 14.5 % (ref 11.5–15.5)
WBC: 15.8 10*3/uL — ABNORMAL HIGH (ref 4.0–10.5)
nRBC: 0 % (ref 0.0–0.2)

## 2021-04-20 LAB — COMPREHENSIVE METABOLIC PANEL
ALT: 20 U/L (ref 0–44)
AST: 44 U/L — ABNORMAL HIGH (ref 15–41)
Albumin: 2.8 g/dL — ABNORMAL LOW (ref 3.5–5.0)
Alkaline Phosphatase: 34 U/L — ABNORMAL LOW (ref 38–126)
Anion gap: 6 (ref 5–15)
BUN: 23 mg/dL (ref 8–23)
CO2: 27 mmol/L (ref 22–32)
Calcium: 8.5 mg/dL — ABNORMAL LOW (ref 8.9–10.3)
Chloride: 107 mmol/L (ref 98–111)
Creatinine, Ser: 0.91 mg/dL (ref 0.44–1.00)
GFR, Estimated: 60 mL/min — ABNORMAL LOW (ref >60)
Glucose, Bld: 142 mg/dL — ABNORMAL HIGH (ref 70–99)
Potassium: 3.9 mmol/L (ref 3.5–5.1)
Sodium: 140 mmol/L (ref 135–145)
Total Bilirubin: 0.8 mg/dL (ref 0.3–1.2)
Total Protein: 5.7 g/dL — ABNORMAL LOW (ref 6.5–8.1)

## 2021-04-20 LAB — GLUCOSE, CAPILLARY: Glucose-Capillary: 127 mg/dL — ABNORMAL HIGH (ref 70–99)

## 2021-04-20 MED ORDER — HYDROCODONE-ACETAMINOPHEN 5-325 MG PO TABS: 1.0000 | ORAL_TABLET | Freq: Four times a day (QID) | ORAL | Status: DC | PRN

## 2021-04-20 MED ORDER — ATENOLOL 25 MG PO TABS
25.0000 mg | ORAL_TABLET | Freq: Every day | ORAL | Status: DC
  Administered 2021-04-20: 25 mg via ORAL
  Filled 2021-04-20: qty 1

## 2021-04-20 MED ORDER — ASPIRIN 81 MG PO CHEW: 81.0000 mg | CHEWABLE_TABLET | Freq: Two times a day (BID) | ORAL | Status: AC

## 2021-04-20 MED ORDER — AMLODIPINE BESYLATE 5 MG PO TABS
5.0000 mg | ORAL_TABLET | Freq: Every day | ORAL | Status: DC
  Administered 2021-04-20: 5 mg via ORAL
  Filled 2021-04-20: qty 1

## 2021-04-20 MED ORDER — HYDROCODONE-ACETAMINOPHEN 5-325 MG PO TABS: 1.0000 | ORAL_TABLET | Freq: Four times a day (QID) | ORAL | 0 refills | Status: DC | PRN

## 2021-04-20 NOTE — Discharge Summary (Signed)
Physician Discharge Summary  Dominique Farrell RCV:[id number] DOB: 1928/06/24 DOA: 04/17/2021  PCP: Monico Blitz, MD  Admit date: 04/17/2021 Discharge date: 04/20/2021  Admitted From: Home Disposition:  SNF  Recommendations for Outpatient Follow-up:  1. Follow up with PCP in 1-2 weeks 2. Please obtain BMP/CBC in one week     Discharge Condition: Stable CODE STATUS: FULL Diet recommendation: Heart Healthy    Brief/Interim Summary: 87 y.o.femalewith medical history significant forhypertension, dyslipidemia. Patient presented to the ED with reports of aaccidental fall, sustaining a left hip injury, fracture.  She denied syncope.  She lives by herself and independent at baseline.   ED Course:Stable vitals. Acute displaced fracture of the proximal left femur. Portable chest x-ray without acute abnormality. ED provider talked to orthopedist Dr. Amedeo Kinsman, okay to admit here, will see patient in consult.  She underwent left hemiarthroplasty on 04/18/21.  PT was consulted post op and recommended SNF.  Discharge Diagnoses:  Closed left proximal femur fracture -status post mechanical fall.Pelvicx-ray shows acute displaced fracture of the proximal left femur. Chest x-ray without acute abnormality. -Judicious opioids -04/18/21--left hemiarthroplasty -PT>>SNF -ASA 81 mg bid x 6 weeks per ortho  Postop delirum -due to anesthesia -suspect component of sundowning -04/19/21--improved  Hypokalemia -Repleted  Hypertension -remained stable -HoldHCTZ for now whilehydrating>>restart -restart atenolol, amlodipine  Dyslipidemia-  -Resume statin-postop  Chronic lower extremity swelling- R > L,chronic and unchanged. [facility] everywhere DVT studies 10/2020 negative bilaterally. Continue to monitor  Discharge Instructions   Allergies as of 04/20/2021      Reactions   Sulfa Antibiotics Nausea Only      Medication List    STOP taking these medications   aspirin EC 81 MG  tablet Replaced by: aspirin 81 MG chewable tablet   atorvastatin 20 MG tablet Commonly known as: LIPITOR     TAKE these medications   Allergy Relief 180 MG tablet Generic drug: fexofenadine Take 180 mg by mouth daily.   amLODipine 5 MG tablet Commonly known as: NORVASC Take 1 tablet by mouth daily.   aspirin 81 MG chewable tablet Chew 1 tablet (81 mg total) by mouth 2 (two) times daily. X 6 weeks Replaces: aspirin EC 81 MG tablet   atenolol 25 MG tablet Commonly known as: TENORMIN Take 25 mg by mouth daily.   hydrochlorothiazide 25 MG tablet Commonly known as: HYDRODIURIL Take 1 tablet by mouth daily.   HYDROcodone-acetaminophen 5-325 MG tablet Commonly known as: NORCO/VICODIN Take 1 tablet by mouth every 6 (six) hours as needed for moderate pain.   rosuvastatin 5 MG tablet Commonly known as: CRESTOR Take 5 mg by mouth daily.   triamcinolone cream 0.1 % Commonly known as: KENALOG Apply 1 application topically daily.       Allergies  Allergen Reactions  . Sulfa Antibiotics Nausea Only    Consultations:  ortho   Procedures/Studies: DG Chest Portable 1 View  Result Date: 04/17/2021 CLINICAL DATA:  [place], left hip pain and fracture EXAM: PORTABLE CHEST 1 VIEW COMPARISON:  04/13/2020 FINDINGS: 2 frontal views of the chest demonstrate an unremarkable cardiac silhouette. Stable atherosclerosis of the aortic arch. No airspace disease, effusion, or pneumothorax. No acute bony abnormalities. IMPRESSION: 1. Stable chest, no acute process. Electronically Signed   By: Randa Ngo M.D.   On: 04/17/2021 17:12   DG HIP UNILAT WITH PELVIS 2-3 VIEWS LEFT  Result Date: 04/18/2021 CLINICAL DATA:  Postop EXAM: DG HIP (WITH OR WITHOUT PELVIS) 2-3V LEFT COMPARISON:  04/17/2021 FINDINGS: SI joints are non widened. The pubic  symphysis appears intact. Interval left hip hemiarthroplasty with intact hardware and normal alignment. Gas in the soft tissues consistent with recent surgery  IMPRESSION: Interval left hip hemiarthroplasty with expected surgical changes Electronically Signed   By: Donavan Foil M.D.   On: 04/18/2021 17:57   DG Hip Unilat W or Wo Pelvis 2-3 Views Left  Result Date: 04/17/2021 CLINICAL DATA:  Pain status post fall EXAM: DG HIP (WITH OR WITHOUT PELVIS) 2-3V LEFT COMPARISON:  None. FINDINGS: There is acute displaced transcervical fracture of the proximal left femur. No dislocation. There are degenerative changes of the hips. There is diffuse osteopenia. IMPRESSION: Acute displaced fracture of the proximal left femur. Electronically Signed   By: Constance Holster M.D.   On: 04/17/2021 16:55        Discharge Exam: Vitals:   04/19/21 2144 04/20/21 0637  BP:  (!) 169/83  Pulse:  97  Resp:  18  Temp:  98.2 F (36.8 C)  SpO2: 94% 94%   Vitals:   04/19/21 1314 04/19/21 2058 04/19/21 2144 04/20/21 0637  BP: (!) 139/58 (!) 158/64  (!) 169/83  Pulse: 87 (!) 102  97  Resp: 17 16  18   Temp: 97.8 F (36.6 C) (!) 97.2 F (36.2 C)  98.2 F (36.8 C)  TempSrc: Oral     SpO2: 96% 94% 94% 94%  Weight:      Height:        General: Pt is alert, awake, not in acute distress Cardiovascular: RRR, S1/S2 +, no rubs, no gallops Respiratory: CTA bilaterally, no wheezing, no rhonchi Abdominal: Soft, NT, ND, bowel sounds + Extremities: 1+ LE edema, no cyanosis   The results of significant diagnostics from this hospitalization (including imaging, microbiology, ancillary and laboratory) are listed below for reference.    Significant Diagnostic Studies: DG Chest Portable 1 View  Result Date: 04/17/2021 CLINICAL DATA:  [place], left hip pain and fracture EXAM: PORTABLE CHEST 1 VIEW COMPARISON:  04/13/2020 FINDINGS: 2 frontal views of the chest demonstrate an unremarkable cardiac silhouette. Stable atherosclerosis of the aortic arch. No airspace disease, effusion, or pneumothorax. No acute bony abnormalities. IMPRESSION: 1. Stable chest, no acute process.  Electronically Signed   By: Randa Ngo M.D.   On: 04/17/2021 17:12   DG HIP UNILAT WITH PELVIS 2-3 VIEWS LEFT  Result Date: 04/18/2021 CLINICAL DATA:  Postop EXAM: DG HIP (WITH OR WITHOUT PELVIS) 2-3V LEFT COMPARISON:  04/17/2021 FINDINGS: SI joints are non widened. The pubic symphysis appears intact. Interval left hip hemiarthroplasty with intact hardware and normal alignment. Gas in the soft tissues consistent with recent surgery IMPRESSION: Interval left hip hemiarthroplasty with expected surgical changes Electronically Signed   By: Donavan Foil M.D.   On: 04/18/2021 17:57   DG Hip Unilat W or Wo Pelvis 2-3 Views Left  Result Date: 04/17/2021 CLINICAL DATA:  Pain status post fall EXAM: DG HIP (WITH OR WITHOUT PELVIS) 2-3V LEFT COMPARISON:  None. FINDINGS: There is acute displaced transcervical fracture of the proximal left femur. No dislocation. There are degenerative changes of the hips. There is diffuse osteopenia. IMPRESSION: Acute displaced fracture of the proximal left femur. Electronically Signed   By: Constance Holster M.D.   On: 04/17/2021 16:55     Microbiology: Recent Results (from the past 240 hour(s))  Resp Panel by RT-PCR (Flu A&B, Covid) Nasopharyngeal Swab     Status: None   Collection Time: 04/17/21  3:40 PM   Specimen: Nasopharyngeal Swab; Nasopharyngeal(NP) swabs in vial transport medium  Result Value Ref Range Status   SARS Coronavirus 2 by RT PCR NEGATIVE NEGATIVE Final    Comment: (NOTE) SARS-CoV-2 target nucleic acids are NOT DETECTED.  The SARS-CoV-2 RNA is generally detectable in upper respiratory specimens during the acute phase of infection. The lowest concentration of SARS-CoV-2 viral copies this assay can detect is 138 copies/mL. A negative result does not preclude SARS-Cov-2 infection and should not be used as the sole basis for treatment or other patient management decisions. A negative result may occur with  improper specimen collection/handling,  submission of specimen other than nasopharyngeal swab, presence of viral mutation(s) within the areas targeted by this assay, and inadequate number of viral copies(<138 copies/mL). A negative result must be combined with clinical observations, patient history, and epidemiological information. The expected result is Negative.  Fact Sheet for Patients:  EntrepreneurPulse.com.au  Fact Sheet for Healthcare Providers:  IncredibleEmployment.be  This test is no t yet approved or cleared by the [place] FDA and  has been authorized for detection and/or diagnosis of SARS-CoV-2 by FDA under an Emergency Use Authorization (EUA). This EUA will remain  in effect (meaning this test can be used) for the duration of the COVID-19 declaration under Section 564(b)(1) of the Act, 21 U.S.C.section 360bbb-3(b)(1), unless the authorization is terminated  or revoked sooner.       Influenza A by PCR NEGATIVE NEGATIVE Final   Influenza B by PCR NEGATIVE NEGATIVE Final    Comment: (NOTE) The Xpert Xpress SARS-CoV-2/FLU/RSV plus assay is intended as an aid in the diagnosis of influenza from Nasopharyngeal swab specimens and should not be used as a sole basis for treatment. Nasal washings and aspirates are unacceptable for Xpert Xpress SARS-CoV-2/FLU/RSV testing.  Fact Sheet for Patients: EntrepreneurPulse.com.au  Fact Sheet for Healthcare Providers: IncredibleEmployment.be  This test is not yet approved or cleared by the [place] FDA and has been authorized for detection and/or diagnosis of SARS-CoV-2 by FDA under an Emergency Use Authorization (EUA). This EUA will remain in effect (meaning this test can be used) for the duration of the COVID-19 declaration under Section 564(b)(1) of the Act, 21 U.S.C. section 360bbb-3(b)(1), unless the authorization is terminated or revoked.  Performed at [facility], [address] [place], [place] [address]   Surgical pcr screen     Status: Abnormal   Collection Time: 04/18/21  8:09 AM   Specimen: Nasal Mucosa; Nasal Swab  Result Value Ref Range Status   MRSA, PCR POSITIVE (A) NEGATIVE Final    Comment: CRITICAL RESULT CALLED TO, READ BACK BY AND VERIFIED WITH: TORI LEONARD 04/18/21 @ 1043 BY S BEARD    Staphylococcus aureus POSITIVE (A) NEGATIVE Final    Comment: (NOTE) The Xpert SA Assay (FDA approved for NASAL specimens in patients 55 years of age and older), is one component of a comprehensive surveillance program. It is not intended to diagnose infection nor to guide or monitor treatment. Performed at [facility], [address], [place], [place] [address]      Labs: Basic Metabolic Panel: Recent Labs  Lab 04/17/21 1614 04/18/21 0417 04/20/21 0549  NA 137 138 140  K 3.2* 4.1 3.9  CL 104 106 107  CO2 24 26 27   GLUCOSE 223* 131* 142*  BUN 24* 21 23  CREATININE 0.95 0.91 0.91  CALCIUM 8.8* 8.8* 8.5*   Liver Function Tests: Recent Labs  Lab 04/17/21 1614 04/20/21 0549  AST 26 44*  ALT 23 20  ALKPHOS 47 34*  BILITOT 0.7  0.8  PROT 6.5 5.7*  ALBUMIN 3.6 2.8*   No results for input(s): LIPASE, AMYLASE in the last 168 hours. No results for input(s): AMMONIA in the last 168 hours. CBC: Recent Labs  Lab 04/17/21 1614 04/19/21 0415 04/20/21 0549  WBC 15.5* 15.4* 15.8*  NEUTROABS 12.7*  --   --   HGB 12.9 11.0* 10.5*  HCT 37.0 33.3* 31.2*  MCV 93.4 94.9 96.3  PLT 249 188 185   Cardiac Enzymes: No results for input(s): CKTOTAL, CKMB, CKMBINDEX, TROPONINI in the last 168 hours. BNP: Invalid input(s): POCBNP CBG: Recent Labs  Lab 04/20/21 0807  GLUCAP 127*    Time coordinating discharge:  36 minutes  Signed:  Orson Eva, DO [facility] Pager: [phone] 04/20/2021, 10:17 AM

## 2021-04-20 NOTE — Progress Notes (Signed)
Physical Therapy Treatment Patient Details Name: Dominique Farrell MRN: [id number] DOB: 07-03-1928 Today's Date: 04/20/2021    History of Present Illness Dominique Farrell is a 87 y.o. female s/p Cemented left hip hemiarthroplasty on 04/18/21 with medical history significant for hypertension, dyslipidemia.  Patient presented to the ED with reports of a fall.  Patient reports she had just finished putting stress away, when she turned suddenly on her leg, lost her balance and fell on her left side.  She did not hit her head.  She did not lose consciousness.  She denies frequent falls.  No cardiac or lung history.    PT Comments    Patient demonstrates slow labored movement for sitting up at bedside with fair carryover for propping up on elbows to hand due to generalized weakness, requires less assistance for sit to stands, but requires verbal cues for proper hand placement during stand to sitting due to not reaching for armrest of chair.  Patient demonstrates increased endurance/distance for gait training with fair return for left heel to toe stepping, limited mostly due to fatigue and mild difficulty breathing.  Patient tolerated sitting up in chair after therapy with her family member present in room.  Patient will benefit from continued physical therapy in hospital and recommended venue below to increase strength, balance, endurance for safe ADLs and gait.     Follow Up Recommendations  SNF     Equipment Recommendations  None recommended by PT    Recommendations for Other Services       Precautions / Restrictions Precautions Precautions: Fall;Posterior Hip Required Braces or Orthoses: Other Brace Other Brace: Abduction pillow when in bed, sitting Restrictions Weight Bearing Restrictions: Yes LLE Weight Bearing: Weight bearing as tolerated    Mobility  Bed Mobility Overal bed mobility: Needs Assistance Bed Mobility: Supine to Sit     Supine to sit: Mod assist     General bed mobility  comments: increased time, labored movement, fair return for propping up on elbows to hands with Mod assist    Transfers Overall transfer level: Needs assistance Equipment used: Rolling walker (2 wheeled) Transfers: Sit to/from [facility] Sit to Stand: Min assist Stand pivot transfers: Min assist       General transfer comment: tends to drop in chair during stands to sit with poor carryover for reaching for armrest of chair  Ambulation/Gait Ambulation/Gait assistance: Min assist Gait Distance (Feet): 55 Feet Assistive device: Rolling walker (2 wheeled) Gait Pattern/deviations: Decreased step length - right;Decreased step length - left;Decreased stride length;Decreased stance time - left;Antalgic Gait velocity: decreased   General Gait Details: increased endurance/distance for ambulation with fair return for left heel to toe stepping, no loss of balance, limited secondary to fatigue and mild difficulty breathing   Stairs             Wheelchair Mobility    Modified Rankin (Stroke Patients Only)       Balance Overall balance assessment: Needs assistance Sitting-balance support: Feet supported;No upper extremity supported Sitting balance-Leahy Scale: Fair Sitting balance - Comments: fair/good seated at EOB   Standing balance support: During functional activity;Bilateral upper extremity supported Standing balance-Leahy Scale: Fair Standing balance comment: using RW                            Cognition Arousal/Alertness: Awake/alert Behavior During Therapy: WFL for tasks assessed/performed Overall Cognitive Status: Within Functional Limits for tasks assessed  Exercises Total Joint Exercises Ankle Circles/Pumps: Supine;15 reps;Both;Strengthening;AROM Quad Sets: Supine;Both;10 reps;Strengthening;AROM Short Arc Quad: Supine;10 reps;Left;Strengthening;AROM Heel Slides: Supine;10  reps;Left;Strengthening;AROM    General Comments        Pertinent Vitals/Pain Pain Assessment: Faces Faces Pain Scale: Hurts little more Pain Location: left hip with movement Pain Descriptors / Indicators: Sore;Grimacing Pain Intervention(s): Limited activity within patient's tolerance;Monitored during session;Premedicated before session;Repositioned    Home Living                      Prior Function            PT Goals (current goals can now be found in the care plan section) Acute Rehab PT Goals Patient Stated Goal: return home with family to assist PT Goal Formulation: With patient/family Time For Goal Achievement: 05/03/21 Potential to Achieve Goals: Good Progress towards PT goals: Progressing toward goals    Frequency    Min 4X/week      PT Plan Current plan remains appropriate    Co-evaluation              AM-PAC PT "6 Clicks" Mobility   Outcome Measure  Help needed turning from your back to your side while in a flat bed without using bedrails?: A Lot Help needed moving from lying on your back to sitting on the side of a flat bed without using bedrails?: A Lot Help needed moving to and from a bed to a chair (including a wheelchair)?: A Lot Help needed standing up from a chair using your arms (e.g., wheelchair or bedside chair)?: A Little Help needed to walk in hospital room?: A Little Help needed climbing 3-5 steps with a railing? : A Lot 6 Click Score: 14    End of Session   Activity Tolerance: Patient tolerated treatment well;Patient limited by fatigue Patient left: in chair;with call bell/phone within reach;with family/visitor present Nurse Communication: Mobility status PT Visit Diagnosis: Unsteadiness on feet (R26.81);Other abnormalities of gait and mobility (R26.89);Muscle weakness (generalized) (M62.81)     Time: 1005-1036 PT Time Calculation (min) (ACUTE ONLY): 31 min  Charges:  $Gait Training: 8-22 mins $Therapeutic  Exercise: 8-22 mins                     11:59 AM, 04/20/21 Lonell Grandchild, MPT Physical Therapist with [facility] [phone] office [phone] mobile phone

## 2021-04-20 NOTE — Progress Notes (Signed)
   ORTHOPAEDIC PROGRESS NOTE  s/p Procedure(s): ARTHROPLASTY BIPOLAR HIP (HEMIARTHROPLASTY); left hip  DOS: 04/18/21  SUBJECTIVE: No issues over night.  No confusion.  Worked well with PT yesterday. Occasional dull pain in hip, otherwise she feels good.  Waiting on placement   OBJECTIVE: PE:  Alert and oriented. No confusion or paranoia today.  Hip abduction pillow in place.  Dressing in place.  Clean, dry and intact.  Toes are warm and well perfused.  Active motion intact to the EHL/TA Sensation to left foot intact.  Vitals:   04/19/21 2144 04/20/21 0637  BP:  (!) 169/83  Pulse:  97  Resp:  18  Temp:  98.2 F (36.8 C)  SpO2: 94% 94%   XR of the left hip demonstrates good position of hip prosthesis without hardware failure or loosening.  No fractures.   ASSESSMENT: Dominique Farrell is a 87 y.o. female doing well postoperatively.  PLAN: Weightbearing: WBAT LLE ; Posterior hip precautions.  Insicional and dressing care: Reinforce dressings as needed Orthopedic device(s): Hip abduction pillow VTE prophylaxis: Aspirin 81mg  BID x 6 weeks Pain control: PRN pain medications.  Avoid sedating medications if possible Follow - up plan: 2 weeks   Contact information:     Kariah Loredo A. Amedeo Kinsman, MD Saline Yeoman [address] [place],  [place]  [address] Phone: [phone] Fax: [phone]

## 2021-04-20 NOTE — TOC Transition Note (Signed)
Transition of Care [facility]) - CM/SW Discharge Note   Patient Details  Name: Dominique Farrell MRN: [id number] Date of Birth: 1928-05-22  [facility] CM/SW Contact:  Boneta Lucks, RN Phone Number: 04/20/2021, 10:33 AM   Clinical Narrative:  Patient medically ready to discharge. Discussed bed offers with Verlene Mayer at the bedside.  Bill wants [facility] and confirm that is okay with patient. DC summary sent to Marianna Fuss, RN to call report.    Final next level of care: Skilled Nursing Facility Barriers to Discharge: Barriers Resolved   Patient Goals and CMS Choice Patient states their goals for this hospitalization and ongoing recovery are:: to go to SNF. CMS Medicare.gov Compare Post Acute Care list provided to:: Patient Choice offered to / list presented to : Patient  Discharge Placement            Patient to be transferred to facility by: [facility] staff Name of family member notified: Bill Patient and family notified of of transfer: 04/20/21  Discharge Plan and Services           Readmission Risk Interventions Readmission Risk Prevention Plan 04/20/2021 04/18/2021  Post Dischage Appt Complete -  Medication Screening - Complete  Transportation Screening - Complete  Some recent data might be hidden

## 2021-04-20 NOTE — Care Management Important Message (Signed)
Important Message  Patient Details  Name: Dominique Farrell MRN: [id number] Date of Birth: 1928-04-11   Medicare Important Message Given:  Yes     Tommy Medal 04/20/2021, 1:49 PM

## 2021-04-21 ENCOUNTER — Non-Acute Institutional Stay (SKILLED_NURSING_FACILITY): Payer: Medicare Other | Admitting: Internal Medicine

## 2021-04-21 ENCOUNTER — Other Ambulatory Visit: Payer: Self-pay | Admitting: Adult Health

## 2021-04-21 ENCOUNTER — Encounter: Payer: Self-pay | Admitting: Internal Medicine

## 2021-04-21 DIAGNOSIS — I10 Essential (primary) hypertension: Secondary | ICD-10-CM

## 2021-04-21 DIAGNOSIS — E785 Hyperlipidemia, unspecified: Secondary | ICD-10-CM

## 2021-04-21 DIAGNOSIS — S72002D Fracture of unspecified part of neck of left femur, subsequent encounter for closed fracture with routine healing: Secondary | ICD-10-CM

## 2021-04-21 MED ORDER — ROSUVASTATIN CALCIUM 5 MG PO TABS: 5.0000 mg | ORAL_TABLET | Freq: Every day | ORAL | 0 refills | Status: AC

## 2021-04-21 MED ORDER — HYDROCHLOROTHIAZIDE 25 MG PO TABS: 1.0000 | ORAL_TABLET | Freq: Every day | ORAL | 0 refills | Status: AC

## 2021-04-21 MED ORDER — AMLODIPINE BESYLATE 5 MG PO TABS: 1.0000 | ORAL_TABLET | Freq: Every day | ORAL | 0 refills | Status: AC

## 2021-04-21 MED ORDER — ATENOLOL 25 MG PO TABS: 25.0000 mg | ORAL_TABLET | Freq: Every day | ORAL | 0 refills | Status: AC

## 2021-04-21 MED ORDER — TRIAMCINOLONE ACETONIDE 0.1 % EX CREA: 1.0000 "application " | TOPICAL_CREAM | Freq: Every day | CUTANEOUS | 0 refills | Status: AC

## 2021-04-21 NOTE — Patient Instructions (Addendum)
See assessment and plan under each diagnosis in the problem list and acutely for this visit. She was not taking the narcotics at the SNF and these will be discontinued.  For hip pain it is recommend that she take 1-2 Aleve and 1-2 Tylenol after a meal 3 times a day as needed for up to 5 days. Total time 85 minutes; greater than 50% of the visit spent counseling patient and coordinating care for problems addressed at this encounter

## 2021-04-21 NOTE — Assessment & Plan Note (Addendum)
Patient is refusing PT/OT at the SNF.  She had walked 50 feet with a rolling walker while in the hospital.  At the facility she was able to demonstrate that she can stand and pivot without help.  She was walking 50-60 feet with supervision only.  She also was going to the bathroom without help which was approximately 50 feet distance from the bed employing a rolling walker. Vicodin was discontinued as she was not taking it at the SNF.  She is oriented.  Despite discussions concerning risk of her going home; she is insistent. Orthopedic follow-up as scheduled.

## 2021-04-21 NOTE — Assessment & Plan Note (Signed)
On low dose Crestor ASA has been D/Ced by PCP as per her history

## 2021-04-21 NOTE — Progress Notes (Addendum)
NURSING HOME LOCATION:  [facility]  ROOM NUMBER:  [id number]  CODE STATUS:  Full Code  PCP:  Monico Blitz MD  This is a comprehensive admission note to this SNF performed on this date less than 30 days from date of admission. Included are preadmission medical/surgical history; reconciled medication list; family history; social history and comprehensive review of systems.  Corrections and additions to the records were documented. Comprehensive physical exam was also performed. Additionally a clinical summary was entered for each active diagnosis pertinent to this admission in the Problem List to enhance continuity of care.  HPI: Patient was hospitalized 5/2- 04/20/2021 with a proximal left femur fracture sustained in a mechanical fall.  She denies any cardiac or neurologic prodrome prior to the fall.  She states she was putting groceries away in the kitchen and turned quickly and lost her balance and fell.  Dr. Amedeo Kinsman performed a left hip hemiarthroscopy 04/18/2021. Postop the patient exhibited some delirium related to the anesthesia and some sundowning. Hydrochlorothiazide was held because of hypokalemia which was repleted.  Atenolol and amlodipine were continued for the hypertension. The patient was able to walk 50 feet with a rolling walker postop.  She was discharged to this facility for PT/OT.  Here the patient has declined PT/OT.  They did assess her and found that she can stand and pivot without help and walk 50-60 feet with supervision only.  She has gotten out of bed by herself on 2 occasions and clinically is fairly stable.  She has walked to the bathroom without help which is approximately 50 feet using a rolling walker.  Past medical and surgical history: Includes dyslipidemia and essential hypertension.  She apparently was treated for scabies by several Dermatologists in August of last year. 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 Surgeries and procedures include bilateral cataract extractions.  Social history: Nondrinker; never smoked.  Family history: Noncontributory due to advanced age.   Review of systems: She is oriented giving the date as "Apr 20, 2021".  She named the [employer]. She also told me that [place] was attacking " [place]" meaning the [place] . She was complaining of "things not going well".  She comments that staff were "talking to me rough".  She also is upset with her niece's husband who she states is "belligerent and a lot of trouble". She is hyper focusing on scabies pointing out the diffuse bruising on her forearms stating that her thin skin was the cause. She denied any cardiac or neurologic prodrome prior to the fall.  She also denied any postural symptoms.  Constitutional: No fever, significant weight change, fatigue  Eyes: No redness, discharge, pain, vision change ENT/mouth: No nasal congestion, purulent discharge, earache, change in hearing, sore throat  Cardiovascular: No chest pain, palpitations, paroxysmal nocturnal dyspnea, claudication, edema  Respiratory: No cough, sputum production, hemoptysis, DOE, significant snoring, apnea Gastrointestinal: No heartburn, dysphagia, abdominal pain, nausea /vomiting, rectal bleeding, melena, change in bowels Genitourinary: No dysuria, hematuria, pyuria, incontinence, nocturia Musculoskeletal: No joint stiffness, joint swelling, weakness Neurologic: No dizziness, headache, syncope, seizures, numbness, tingling Psychiatric: No significant anxiety, depression, insomnia, anorexia Endocrine: No change in hair/skin/nails, excessive thirst, excessive hunger, excessive urination  Hematologic/lymphatic: No significant bruising, lymphadenopathy, abnormal bleeding  Physical exam:  Pertinent or positive findings: She appears younger than her stated age.  She is slightly hard of hearing.  The left nasolabial  fold is slightly decreased.  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  She states that these are all crowns.  Heart sounds are slightly distant.  She has minor rales at the bases.  1+ pitting edema of the lower extremities is present.  Pedal pulses are palpable.  There is diffuse scarring over the lower extremities bilaterally & post trauma scar L inferior shin. She also has severe diffuse bruising over the forearms.  General appearance: Adequately nourished; no acute distress, increased work of breathing is present.   Lymphatic: No lymphadenopathy about the head, neck, axilla. Eyes: No conjunctival inflammation or lid edema is present. There is no scleral icterus. Ears:  External ear exam shows no significant lesions or deformities.   Nose:  External nasal examination shows no deformity or inflammation. Nasal mucosa are pink and moist without lesions, exudates Oral exam: Lips and gums are healthy appearing.There is no oropharyngeal erythema or exudate. Neck:  No thyromegaly, masses, tenderness noted.    Heart:  Normal rate and regular rhythm. S1 and S2 normal without gallop, murmur, click, rub.  Lungs:  without wheezes, rhonchi, rubs. Abdomen: Bowel sounds are normal.  Abdomen is soft and nontender with no organomegaly, hernias, masses. GU: Deferred  Extremities:  No cyanosis, clubbing. Neurologic exam:  Strength good  in upper & R  lower extremities. Balance, Rhomberg, finger to nose testing could not be completed due to clinical state Skin: Warm & dry w/o tenting.  See clinical summary under each active problem in the Problem List with associated updated therapeutic plan.  Patient insisted on going home; she was oriented.  PT/OT evaluation suggests that risk is minimal of repeated fall.  She left with Mr. Verlene Mayer, who is the husband of daughter of patient's sister.

## 2021-04-21 NOTE — Assessment & Plan Note (Signed)
Blood pressure is slightly elevated but she is very agitated at this time and wants to go home.  Because of the hypokalemia for which HCTZ was held; follow-up electrolytes should be performed by the PCP.

## 2021-04-22 DIAGNOSIS — Z9181 History of falling: Secondary | ICD-10-CM | POA: Diagnosis not present

## 2021-04-22 DIAGNOSIS — E876 Hypokalemia: Secondary | ICD-10-CM | POA: Diagnosis not present

## 2021-04-22 DIAGNOSIS — Z7982 Long term (current) use of aspirin: Secondary | ICD-10-CM | POA: Diagnosis not present

## 2021-04-22 DIAGNOSIS — E785 Hyperlipidemia, unspecified: Secondary | ICD-10-CM | POA: Diagnosis not present

## 2021-04-22 DIAGNOSIS — W19XXXD Unspecified fall, subsequent encounter: Secondary | ICD-10-CM | POA: Diagnosis not present

## 2021-04-22 DIAGNOSIS — R410 Disorientation, unspecified: Secondary | ICD-10-CM | POA: Diagnosis not present

## 2021-04-22 DIAGNOSIS — I10 Essential (primary) hypertension: Secondary | ICD-10-CM | POA: Diagnosis not present

## 2021-04-22 DIAGNOSIS — S72002D Fracture of unspecified part of neck of left femur, subsequent encounter for closed fracture with routine healing: Secondary | ICD-10-CM | POA: Diagnosis not present

## 2021-04-22 DIAGNOSIS — Z96642 Presence of left artificial hip joint: Secondary | ICD-10-CM | POA: Diagnosis not present

## 2021-04-24 DIAGNOSIS — E785 Hyperlipidemia, unspecified: Secondary | ICD-10-CM | POA: Diagnosis not present

## 2021-04-24 DIAGNOSIS — S72002D Fracture of unspecified part of neck of left femur, subsequent encounter for closed fracture with routine healing: Secondary | ICD-10-CM | POA: Diagnosis not present

## 2021-04-24 DIAGNOSIS — E876 Hypokalemia: Secondary | ICD-10-CM | POA: Diagnosis not present

## 2021-04-24 DIAGNOSIS — R410 Disorientation, unspecified: Secondary | ICD-10-CM | POA: Diagnosis not present

## 2021-04-24 DIAGNOSIS — W19XXXD Unspecified fall, subsequent encounter: Secondary | ICD-10-CM | POA: Diagnosis not present

## 2021-04-24 DIAGNOSIS — I10 Essential (primary) hypertension: Secondary | ICD-10-CM | POA: Diagnosis not present

## 2021-04-25 DIAGNOSIS — I10 Essential (primary) hypertension: Secondary | ICD-10-CM | POA: Diagnosis not present

## 2021-04-25 DIAGNOSIS — S72002D Fracture of unspecified part of neck of left femur, subsequent encounter for closed fracture with routine healing: Secondary | ICD-10-CM | POA: Diagnosis not present

## 2021-04-25 DIAGNOSIS — Z789 Other specified health status: Secondary | ICD-10-CM | POA: Diagnosis not present

## 2021-04-25 DIAGNOSIS — N183 Chronic kidney disease, stage 3 unspecified: Secondary | ICD-10-CM | POA: Diagnosis not present

## 2021-04-25 DIAGNOSIS — Z6828 Body mass index (BMI) 28.0-28.9, adult: Secondary | ICD-10-CM | POA: Diagnosis not present

## 2021-04-25 DIAGNOSIS — E876 Hypokalemia: Secondary | ICD-10-CM | POA: Diagnosis not present

## 2021-04-25 DIAGNOSIS — E1165 Type 2 diabetes mellitus with hyperglycemia: Secondary | ICD-10-CM | POA: Diagnosis not present

## 2021-04-25 DIAGNOSIS — Z299 Encounter for prophylactic measures, unspecified: Secondary | ICD-10-CM | POA: Diagnosis not present

## 2021-04-25 DIAGNOSIS — E1129 Type 2 diabetes mellitus with other diabetic kidney complication: Secondary | ICD-10-CM | POA: Diagnosis not present

## 2021-04-25 DIAGNOSIS — R410 Disorientation, unspecified: Secondary | ICD-10-CM | POA: Diagnosis not present

## 2021-04-25 DIAGNOSIS — W19XXXD Unspecified fall, subsequent encounter: Secondary | ICD-10-CM | POA: Diagnosis not present

## 2021-04-25 DIAGNOSIS — E785 Hyperlipidemia, unspecified: Secondary | ICD-10-CM | POA: Diagnosis not present

## 2021-04-26 ENCOUNTER — Telehealth: Payer: Self-pay | Admitting: Orthopedic Surgery

## 2021-04-26 DIAGNOSIS — R410 Disorientation, unspecified: Secondary | ICD-10-CM | POA: Diagnosis not present

## 2021-04-26 DIAGNOSIS — E785 Hyperlipidemia, unspecified: Secondary | ICD-10-CM | POA: Diagnosis not present

## 2021-04-26 DIAGNOSIS — S72002D Fracture of unspecified part of neck of left femur, subsequent encounter for closed fracture with routine healing: Secondary | ICD-10-CM | POA: Diagnosis not present

## 2021-04-26 DIAGNOSIS — I10 Essential (primary) hypertension: Secondary | ICD-10-CM | POA: Diagnosis not present

## 2021-04-26 DIAGNOSIS — W19XXXD Unspecified fall, subsequent encounter: Secondary | ICD-10-CM | POA: Diagnosis not present

## 2021-04-26 DIAGNOSIS — E876 Hypokalemia: Secondary | ICD-10-CM | POA: Diagnosis not present

## 2021-04-26 NOTE — Telephone Encounter (Signed)
Patient called and she wants to know if she can take a shower.  She said she really needs to have a shower.  She says she has her sister there with her.  She has a small step in to the shower, a shower chair to sit on and also has grab bars should she need them  I told her that I would send the message for her  Please advise  Thanks

## 2021-04-27 DIAGNOSIS — S72002D Fracture of unspecified part of neck of left femur, subsequent encounter for closed fracture with routine healing: Secondary | ICD-10-CM | POA: Diagnosis not present

## 2021-04-27 DIAGNOSIS — R410 Disorientation, unspecified: Secondary | ICD-10-CM | POA: Diagnosis not present

## 2021-04-27 DIAGNOSIS — W19XXXD Unspecified fall, subsequent encounter: Secondary | ICD-10-CM | POA: Diagnosis not present

## 2021-04-27 DIAGNOSIS — I10 Essential (primary) hypertension: Secondary | ICD-10-CM | POA: Diagnosis not present

## 2021-04-27 DIAGNOSIS — E876 Hypokalemia: Secondary | ICD-10-CM | POA: Diagnosis not present

## 2021-04-27 DIAGNOSIS — E785 Hyperlipidemia, unspecified: Secondary | ICD-10-CM | POA: Diagnosis not present

## 2021-04-27 NOTE — Telephone Encounter (Signed)
Spoke with Dr. Amedeo Kinsman who states pt can take a shower. Called pt and let her know it's ok to shower at this time.

## 2021-05-01 DIAGNOSIS — R059 Cough, unspecified: Secondary | ICD-10-CM | POA: Diagnosis not present

## 2021-05-01 DIAGNOSIS — R609 Edema, unspecified: Secondary | ICD-10-CM | POA: Diagnosis not present

## 2021-05-01 DIAGNOSIS — E1165 Type 2 diabetes mellitus with hyperglycemia: Secondary | ICD-10-CM | POA: Diagnosis not present

## 2021-05-01 DIAGNOSIS — Z299 Encounter for prophylactic measures, unspecified: Secondary | ICD-10-CM | POA: Diagnosis not present

## 2021-05-01 DIAGNOSIS — I10 Essential (primary) hypertension: Secondary | ICD-10-CM | POA: Diagnosis not present

## 2021-05-02 DIAGNOSIS — E785 Hyperlipidemia, unspecified: Secondary | ICD-10-CM | POA: Diagnosis not present

## 2021-05-02 DIAGNOSIS — I10 Essential (primary) hypertension: Secondary | ICD-10-CM | POA: Diagnosis not present

## 2021-05-02 DIAGNOSIS — R410 Disorientation, unspecified: Secondary | ICD-10-CM | POA: Diagnosis not present

## 2021-05-02 DIAGNOSIS — W19XXXD Unspecified fall, subsequent encounter: Secondary | ICD-10-CM | POA: Diagnosis not present

## 2021-05-02 DIAGNOSIS — E876 Hypokalemia: Secondary | ICD-10-CM | POA: Diagnosis not present

## 2021-05-02 DIAGNOSIS — S72002D Fracture of unspecified part of neck of left femur, subsequent encounter for closed fracture with routine healing: Secondary | ICD-10-CM | POA: Diagnosis not present

## 2021-05-03 ENCOUNTER — Encounter: Payer: Self-pay | Admitting: Orthopedic Surgery

## 2021-05-03 ENCOUNTER — Ambulatory Visit (INDEPENDENT_AMBULATORY_CARE_PROVIDER_SITE_OTHER): Payer: Medicare Other | Admitting: Orthopedic Surgery

## 2021-05-03 ENCOUNTER — Other Ambulatory Visit: Payer: Self-pay

## 2021-05-03 ENCOUNTER — Telehealth: Payer: Self-pay | Admitting: Orthopedic Surgery

## 2021-05-03 ENCOUNTER — Ambulatory Visit: Payer: Medicare Other

## 2021-05-03 VITALS — BP 145/62 | HR 69 | Ht 66.0 in | Wt 165.0 lb

## 2021-05-03 DIAGNOSIS — S72002D Fracture of unspecified part of neck of left femur, subsequent encounter for closed fracture with routine healing: Secondary | ICD-10-CM | POA: Diagnosis not present

## 2021-05-03 DIAGNOSIS — Z96642 Presence of left artificial hip joint: Secondary | ICD-10-CM | POA: Diagnosis not present

## 2021-05-03 NOTE — Progress Notes (Signed)
Orthopaedic Postop Note  Assessment: Dominique Farrell is a 87 y.o. female s/p cemented Left hip hemiarthroplasty  DOS: 04/18/21  Plan: Sutures trimmed, steri strips placed Continue using hip abduction pillow while in bed until 6 weeks after surgery; importance was stressed to her in clinic today Continue posterior hip precautions until 3 months postop Continue with DVT prophylaxis for at least 6 weeks after surgery WBAT on the operative extremity Continue medications for bilateral pitting edema at the discretion of primary care provider. Monitor the wound on the dorsal aspect of the left great toe, keep it clean, dry and intact. Follow up in 4 weeks; call with any issues   Follow-up: Return in about 4 weeks (around 05/31/2021). XR at next visit: AP pelvis and Left hip  Subjective:  Chief Complaint  Patient presents with  . Routine Post Op    S/P Lt hip sx DOS: 04/18/21       History of Present Illness: Dominique Farrell is a 87 y.o. female who presents following the above stated procedure.  She is now 2 weeks out, and doing very well.  She was discharged to a skilled nursing facility, but has since returned home.  She is working well with physical therapy.  Her pain is well controlled, not taking anything currently.  She is ambulating well with a walker.  She has been having issues with swelling in the bilateral lower extremities.  She is recently started furosemide as a result.  She has noticed some improvement since starting this medication a couple of days ago.  She also sustained an injury to the dorsal aspect of her left great toe.  She has been treating this with Polysporin.  This is not painful.  No surrounding erythema or drainage.  She feels well otherwise.  No fevers or chills.  She has not been using the hip abduction pillow while sleeping at nighttime.  She states that she will use a pillow occasionally between her legs.  Review of Systems: No fevers or chills No numbness or  tingling No Chest Pain No shortness of breath   Objective: BP (!) 145/62   Pulse 69   Ht 5\' 6"  (1.676 m)   Wt 165 lb (74.8 kg)   BMI 26.63 kg/m   Physical Exam:  Alert and oriented.  No acute distress.  Surgical incision is healing well.  No surrounding erythema or drainage.  She is ambulating very well with the assistance of a walker.  No tenderness over the left lateral hip.  She tolerates gentle range of motion of her hip.  Sensation is intact distally in all nerve distributions to her left foot.  Toes are warm and well-perfused.  She has bilateral pitting edema to the level of the mid tibia.  This has improved in the recent days.  She also has an superficial abrasion on the dorsal aspect of her left great toe.  It is currently moist due to Polysporin.  There is no surrounding erythema.  No active drainage.  She is able to dorsiflex her ankle, as well as her great toe.  IMAGING: I personally ordered and reviewed the following images:  XR of the Left hip and AP pelvis demonstrates a cemented hip arthroplasty in good position.  The hip remains reduced.  There is no evidence of implant subsidence.  No acute fractures are noted.  Impression: Left hip hemiarthroplasty in excellent position   Mordecai Rasmussen, MD 05/03/2021 9:50 AM

## 2021-05-03 NOTE — Telephone Encounter (Signed)
Patient mentioned at check out from today's visit that she forgot to ask if she may start driving? Please advise.

## 2021-05-03 NOTE — Telephone Encounter (Signed)
Left VM with call back information in case of questions.

## 2021-05-04 DIAGNOSIS — R410 Disorientation, unspecified: Secondary | ICD-10-CM | POA: Diagnosis not present

## 2021-05-04 DIAGNOSIS — E876 Hypokalemia: Secondary | ICD-10-CM | POA: Diagnosis not present

## 2021-05-04 DIAGNOSIS — I10 Essential (primary) hypertension: Secondary | ICD-10-CM | POA: Diagnosis not present

## 2021-05-04 DIAGNOSIS — S72002D Fracture of unspecified part of neck of left femur, subsequent encounter for closed fracture with routine healing: Secondary | ICD-10-CM | POA: Diagnosis not present

## 2021-05-04 DIAGNOSIS — W19XXXD Unspecified fall, subsequent encounter: Secondary | ICD-10-CM | POA: Diagnosis not present

## 2021-05-04 DIAGNOSIS — E785 Hyperlipidemia, unspecified: Secondary | ICD-10-CM | POA: Diagnosis not present

## 2021-05-05 DIAGNOSIS — I10 Essential (primary) hypertension: Secondary | ICD-10-CM | POA: Diagnosis not present

## 2021-05-05 DIAGNOSIS — E876 Hypokalemia: Secondary | ICD-10-CM | POA: Diagnosis not present

## 2021-05-05 DIAGNOSIS — W19XXXD Unspecified fall, subsequent encounter: Secondary | ICD-10-CM | POA: Diagnosis not present

## 2021-05-05 DIAGNOSIS — E785 Hyperlipidemia, unspecified: Secondary | ICD-10-CM | POA: Diagnosis not present

## 2021-05-05 DIAGNOSIS — S72002D Fracture of unspecified part of neck of left femur, subsequent encounter for closed fracture with routine healing: Secondary | ICD-10-CM | POA: Diagnosis not present

## 2021-05-05 DIAGNOSIS — R410 Disorientation, unspecified: Secondary | ICD-10-CM | POA: Diagnosis not present

## 2021-05-08 DIAGNOSIS — W19XXXD Unspecified fall, subsequent encounter: Secondary | ICD-10-CM | POA: Diagnosis not present

## 2021-05-08 DIAGNOSIS — S72002D Fracture of unspecified part of neck of left femur, subsequent encounter for closed fracture with routine healing: Secondary | ICD-10-CM | POA: Diagnosis not present

## 2021-05-08 DIAGNOSIS — E785 Hyperlipidemia, unspecified: Secondary | ICD-10-CM | POA: Diagnosis not present

## 2021-05-08 DIAGNOSIS — I10 Essential (primary) hypertension: Secondary | ICD-10-CM | POA: Diagnosis not present

## 2021-05-08 DIAGNOSIS — E876 Hypokalemia: Secondary | ICD-10-CM | POA: Diagnosis not present

## 2021-05-08 DIAGNOSIS — R410 Disorientation, unspecified: Secondary | ICD-10-CM | POA: Diagnosis not present

## 2021-05-09 DIAGNOSIS — W19XXXD Unspecified fall, subsequent encounter: Secondary | ICD-10-CM | POA: Diagnosis not present

## 2021-05-09 DIAGNOSIS — E876 Hypokalemia: Secondary | ICD-10-CM | POA: Diagnosis not present

## 2021-05-09 DIAGNOSIS — R410 Disorientation, unspecified: Secondary | ICD-10-CM | POA: Diagnosis not present

## 2021-05-09 DIAGNOSIS — I10 Essential (primary) hypertension: Secondary | ICD-10-CM | POA: Diagnosis not present

## 2021-05-09 DIAGNOSIS — E785 Hyperlipidemia, unspecified: Secondary | ICD-10-CM | POA: Diagnosis not present

## 2021-05-09 DIAGNOSIS — S72002D Fracture of unspecified part of neck of left femur, subsequent encounter for closed fracture with routine healing: Secondary | ICD-10-CM | POA: Diagnosis not present

## 2021-05-10 DIAGNOSIS — W19XXXD Unspecified fall, subsequent encounter: Secondary | ICD-10-CM | POA: Diagnosis not present

## 2021-05-10 DIAGNOSIS — R410 Disorientation, unspecified: Secondary | ICD-10-CM | POA: Diagnosis not present

## 2021-05-10 DIAGNOSIS — S72002D Fracture of unspecified part of neck of left femur, subsequent encounter for closed fracture with routine healing: Secondary | ICD-10-CM | POA: Diagnosis not present

## 2021-05-10 DIAGNOSIS — E876 Hypokalemia: Secondary | ICD-10-CM | POA: Diagnosis not present

## 2021-05-10 DIAGNOSIS — E785 Hyperlipidemia, unspecified: Secondary | ICD-10-CM | POA: Diagnosis not present

## 2021-05-10 DIAGNOSIS — I10 Essential (primary) hypertension: Secondary | ICD-10-CM | POA: Diagnosis not present

## 2021-05-11 DIAGNOSIS — W19XXXD Unspecified fall, subsequent encounter: Secondary | ICD-10-CM | POA: Diagnosis not present

## 2021-05-11 DIAGNOSIS — E785 Hyperlipidemia, unspecified: Secondary | ICD-10-CM | POA: Diagnosis not present

## 2021-05-11 DIAGNOSIS — I10 Essential (primary) hypertension: Secondary | ICD-10-CM | POA: Diagnosis not present

## 2021-05-11 DIAGNOSIS — S72002D Fracture of unspecified part of neck of left femur, subsequent encounter for closed fracture with routine healing: Secondary | ICD-10-CM | POA: Diagnosis not present

## 2021-05-11 DIAGNOSIS — E876 Hypokalemia: Secondary | ICD-10-CM | POA: Diagnosis not present

## 2021-05-11 DIAGNOSIS — R410 Disorientation, unspecified: Secondary | ICD-10-CM | POA: Diagnosis not present

## 2021-05-17 DIAGNOSIS — I10 Essential (primary) hypertension: Secondary | ICD-10-CM | POA: Diagnosis not present

## 2021-05-17 DIAGNOSIS — R410 Disorientation, unspecified: Secondary | ICD-10-CM | POA: Diagnosis not present

## 2021-05-17 DIAGNOSIS — E785 Hyperlipidemia, unspecified: Secondary | ICD-10-CM | POA: Diagnosis not present

## 2021-05-17 DIAGNOSIS — E876 Hypokalemia: Secondary | ICD-10-CM | POA: Diagnosis not present

## 2021-05-17 DIAGNOSIS — W19XXXD Unspecified fall, subsequent encounter: Secondary | ICD-10-CM | POA: Diagnosis not present

## 2021-05-17 DIAGNOSIS — S72002D Fracture of unspecified part of neck of left femur, subsequent encounter for closed fracture with routine healing: Secondary | ICD-10-CM | POA: Diagnosis not present

## 2021-05-24 DIAGNOSIS — Z6827 Body mass index (BMI) 27.0-27.9, adult: Secondary | ICD-10-CM | POA: Diagnosis not present

## 2021-05-24 DIAGNOSIS — Z299 Encounter for prophylactic measures, unspecified: Secondary | ICD-10-CM | POA: Diagnosis not present

## 2021-05-24 DIAGNOSIS — E7800 Pure hypercholesterolemia, unspecified: Secondary | ICD-10-CM | POA: Diagnosis not present

## 2021-05-24 DIAGNOSIS — R0602 Shortness of breath: Secondary | ICD-10-CM | POA: Diagnosis not present

## 2021-05-24 DIAGNOSIS — I10 Essential (primary) hypertension: Secondary | ICD-10-CM | POA: Diagnosis not present

## 2021-05-24 DIAGNOSIS — R609 Edema, unspecified: Secondary | ICD-10-CM | POA: Diagnosis not present

## 2021-05-29 DIAGNOSIS — R0602 Shortness of breath: Secondary | ICD-10-CM | POA: Diagnosis not present

## 2021-05-31 ENCOUNTER — Ambulatory Visit: Payer: Medicare Other

## 2021-05-31 ENCOUNTER — Other Ambulatory Visit: Payer: Self-pay

## 2021-05-31 ENCOUNTER — Ambulatory Visit (INDEPENDENT_AMBULATORY_CARE_PROVIDER_SITE_OTHER): Payer: Medicare Other | Admitting: Orthopedic Surgery

## 2021-05-31 ENCOUNTER — Encounter: Payer: Self-pay | Admitting: Orthopedic Surgery

## 2021-05-31 VITALS — Ht 66.0 in | Wt 165.0 lb

## 2021-05-31 DIAGNOSIS — Z96642 Presence of left artificial hip joint: Secondary | ICD-10-CM | POA: Diagnosis not present

## 2021-05-31 DIAGNOSIS — S72002D Fracture of unspecified part of neck of left femur, subsequent encounter for closed fracture with routine healing: Secondary | ICD-10-CM

## 2021-05-31 NOTE — Progress Notes (Signed)
Orthopaedic Postop Note  Assessment: Dominique Farrell is a 87 y.o. female s/p cemented Left hip hemiarthroplasty  DOS: 04/18/21  Plan: Overall, she is doing very well following surgery.  She is walking with a cane, and she does not feel as though she needs the cane.  She can resume driving, but I have urged her to practice in a parking lot first.  Medications as needed.  Continue posterior hip precautions.  Follow up with primary care provider regarding her bilateral lower extremity swelling.  Avoid scratching her legs as much as possible, to minimize risk for infection.  All questions answered.    Follow-up: Return in about 6 weeks (around 07/12/2021). XR at next visit: AP pelvis and Left hip  Subjective:  Chief Complaint  Patient presents with   history of left hip hemiarthroplasty    History of Present Illness: Dominique Farrell is a 87 y.o. female who presents following the above stated procedure.  She is now 6 weeks out from surgery, and doing very well.  She is walking with the assistance of a cane, and would like to stop using the cane.  She denies pain in the left hip.  No issues with her surgical incision.  She continues to have bilateral pedal edema, and is following closely with her primary care provider.  She does note some weeping.  She also has issues with eczema, which she attributes to previous bout of scabies.  She has been scratching her legs as a result.  No fevers or chills.    Review of Systems: No fevers or chills No numbness or tingling No Chest Pain No shortness of breath   Objective: Ht 5\' 6"  (1.676 m)   Wt 165 lb (74.8 kg)   BMI 26.63 kg/m   Physical Exam:  Alert and oriented.  No acute distress.  Surgical incision is healing well.  No surrounding erythema or drainage.  She is ambulating very well with the assistance of a cane.  Surgical incision is healing well.  No surrounding erythema or drainage.  No tenderness to palpation of the lateral hip.  She  tolerates gentle range of motion of the left hip.  No pain with axial loading.  She has bilateral pedal edema and discoloration.  Small amount of weeping is appreciated from the left leg.  She has multiple small lesions caused by scratching.  These appear healthy, without obvious drainage.  Small amount of bleeding is noted.  Sensation is intact over the dorsum of the foot.  Active motion intact in the EHL and TA.  IMAGING: I personally ordered and reviewed the following images:  XR of the Left hip and AP pelvis demonstrates a cemented hip arthroplasty in good position.  The hip remains reduced.  No evidence of hardware failure or subsidence.  No acute injuries are noted.   Impression: Left hip hemiarthroplasty in excellent position   Mordecai Rasmussen, MD 05/31/2021 9:48 AM

## 2021-06-06 DIAGNOSIS — E1122 Type 2 diabetes mellitus with diabetic chronic kidney disease: Secondary | ICD-10-CM | POA: Diagnosis not present

## 2021-06-06 DIAGNOSIS — R609 Edema, unspecified: Secondary | ICD-10-CM | POA: Diagnosis not present

## 2021-06-06 DIAGNOSIS — I10 Essential (primary) hypertension: Secondary | ICD-10-CM | POA: Diagnosis not present

## 2021-06-06 DIAGNOSIS — Z299 Encounter for prophylactic measures, unspecified: Secondary | ICD-10-CM | POA: Diagnosis not present

## 2021-06-06 DIAGNOSIS — Z6826 Body mass index (BMI) 26.0-26.9, adult: Secondary | ICD-10-CM | POA: Diagnosis not present

## 2021-06-06 DIAGNOSIS — E1165 Type 2 diabetes mellitus with hyperglycemia: Secondary | ICD-10-CM | POA: Diagnosis not present

## 2021-06-15 DIAGNOSIS — L308 Other specified dermatitis: Secondary | ICD-10-CM | POA: Diagnosis not present

## 2021-07-12 ENCOUNTER — Other Ambulatory Visit: Payer: Self-pay

## 2021-07-12 ENCOUNTER — Encounter: Payer: Self-pay | Admitting: Orthopedic Surgery

## 2021-07-12 ENCOUNTER — Ambulatory Visit: Payer: Medicare Other

## 2021-07-12 ENCOUNTER — Ambulatory Visit (INDEPENDENT_AMBULATORY_CARE_PROVIDER_SITE_OTHER): Payer: Medicare Other | Admitting: Orthopedic Surgery

## 2021-07-12 DIAGNOSIS — S72002D Fracture of unspecified part of neck of left femur, subsequent encounter for closed fracture with routine healing: Secondary | ICD-10-CM

## 2021-07-12 DIAGNOSIS — Z96642 Presence of left artificial hip joint: Secondary | ICD-10-CM

## 2021-07-12 NOTE — Progress Notes (Signed)
Orthopaedic Postop Note  Assessment: Dominique Farrell is a 87 y.o. female s/p cemented Left hip hemiarthroplasty  DOS: 04/18/21  Plan: Mrs. Rhoton has done remarkably well in her recovery.  She is not having any issues with her left hip.  Her incision is doing very well.  We discussed ongoing precautions, as she still at risk for dislocation.  She stated her understanding.  In general, I did encourage her to use a cane to provide some additional stability.  Otherwise, I have advised her to remain safe.  I will plan to see her back at the approximate 1 year anniversary following surgery.  She can contact the clinic if she has any issues before then.   Follow-up: Return in about 9 months (around 04/12/2022). XR at next visit: AP pelvis and Left hip  Subjective:  Chief Complaint  Patient presents with   Hip Pain    L/DOS 04/18/21  Doing good, no issues.    History of Present Illness: Dominique Farrell is a 87 y.o. female who presents following the above stated procedure.  She is now 3 months out from surgery, and continues to do well.  She is not having any pain.  She is not using an assistive device.  No issues with her surgical incisions.    Review of Systems: No fevers or chills No numbness or tingling No Chest Pain No shortness of breath   Objective: There were no vitals taken for this visit.  Physical Exam:  Alert and oriented.  No acute distress.  Surgical incisions are healing well.  No surrounding erythema or drainage.  She walks without an assistive device.  Overall, good stability.  She is able to maintain a straight leg raise.  She tolerates gentle range of motion.  No tenderness along the lateral hip.  Toes are warm and well-perfused.  Active motion intact to the EHL/TA.  IMAGING: I personally ordered and reviewed the following images:  XR of the Left hip and AP pelvis demonstrates a cemented hip arthroplasty in good position.  Her hip is reduced.  There is been no  interval displacement or subsidence of the hardware.  No concerning features.   Impression: Left hip hemiarthroplasty in excellent position   Mordecai Rasmussen, MD 07/12/2021 10:19 AM

## 2021-07-17 DIAGNOSIS — L309 Dermatitis, unspecified: Secondary | ICD-10-CM | POA: Diagnosis not present

## 2021-07-17 DIAGNOSIS — Z299 Encounter for prophylactic measures, unspecified: Secondary | ICD-10-CM | POA: Diagnosis not present

## 2021-07-17 DIAGNOSIS — E7800 Pure hypercholesterolemia, unspecified: Secondary | ICD-10-CM | POA: Diagnosis not present

## 2021-07-17 DIAGNOSIS — N183 Chronic kidney disease, stage 3 unspecified: Secondary | ICD-10-CM | POA: Diagnosis not present

## 2021-07-17 DIAGNOSIS — I10 Essential (primary) hypertension: Secondary | ICD-10-CM | POA: Diagnosis not present

## 2021-07-24 DIAGNOSIS — L309 Dermatitis, unspecified: Secondary | ICD-10-CM | POA: Diagnosis not present

## 2021-07-24 DIAGNOSIS — L280 Lichen simplex chronicus: Secondary | ICD-10-CM | POA: Diagnosis not present

## 2021-07-24 DIAGNOSIS — L0100 Impetigo, unspecified: Secondary | ICD-10-CM | POA: Diagnosis not present

## 2021-07-24 DIAGNOSIS — D485 Neoplasm of uncertain behavior of skin: Secondary | ICD-10-CM | POA: Diagnosis not present

## 2021-08-02 DIAGNOSIS — I10 Essential (primary) hypertension: Secondary | ICD-10-CM | POA: Diagnosis not present

## 2021-08-02 DIAGNOSIS — Z299 Encounter for prophylactic measures, unspecified: Secondary | ICD-10-CM | POA: Diagnosis not present

## 2021-08-03 DIAGNOSIS — L280 Lichen simplex chronicus: Secondary | ICD-10-CM | POA: Diagnosis not present

## 2021-08-03 DIAGNOSIS — L0100 Impetigo, unspecified: Secondary | ICD-10-CM | POA: Diagnosis not present

## 2021-08-24 DIAGNOSIS — D485 Neoplasm of uncertain behavior of skin: Secondary | ICD-10-CM | POA: Diagnosis not present

## 2021-08-24 DIAGNOSIS — L280 Lichen simplex chronicus: Secondary | ICD-10-CM | POA: Diagnosis not present

## 2021-08-24 DIAGNOSIS — L570 Actinic keratosis: Secondary | ICD-10-CM | POA: Diagnosis not present

## 2021-08-30 DIAGNOSIS — Z23 Encounter for immunization: Secondary | ICD-10-CM | POA: Diagnosis not present

## 2021-08-30 DIAGNOSIS — Z6826 Body mass index (BMI) 26.0-26.9, adult: Secondary | ICD-10-CM | POA: Diagnosis not present

## 2021-08-30 DIAGNOSIS — Z1331 Encounter for screening for depression: Secondary | ICD-10-CM | POA: Diagnosis not present

## 2021-08-30 DIAGNOSIS — Z0000 Encounter for general adult medical examination without abnormal findings: Secondary | ICD-10-CM | POA: Diagnosis not present

## 2021-08-30 DIAGNOSIS — Z299 Encounter for prophylactic measures, unspecified: Secondary | ICD-10-CM | POA: Diagnosis not present

## 2021-08-30 DIAGNOSIS — Z79899 Other long term (current) drug therapy: Secondary | ICD-10-CM | POA: Diagnosis not present

## 2021-08-30 DIAGNOSIS — E559 Vitamin D deficiency, unspecified: Secondary | ICD-10-CM | POA: Diagnosis not present

## 2021-08-30 DIAGNOSIS — Z1339 Encounter for screening examination for other mental health and behavioral disorders: Secondary | ICD-10-CM | POA: Diagnosis not present

## 2021-08-30 DIAGNOSIS — Z7189 Other specified counseling: Secondary | ICD-10-CM | POA: Diagnosis not present

## 2021-08-30 DIAGNOSIS — R5383 Other fatigue: Secondary | ICD-10-CM | POA: Diagnosis not present

## 2021-08-30 DIAGNOSIS — E7800 Pure hypercholesterolemia, unspecified: Secondary | ICD-10-CM | POA: Diagnosis not present

## 2021-09-14 DIAGNOSIS — L280 Lichen simplex chronicus: Secondary | ICD-10-CM | POA: Diagnosis not present

## 2021-09-20 DIAGNOSIS — M67431 Ganglion, right wrist: Secondary | ICD-10-CM | POA: Diagnosis not present

## 2021-09-20 DIAGNOSIS — Z299 Encounter for prophylactic measures, unspecified: Secondary | ICD-10-CM | POA: Diagnosis not present

## 2021-09-20 DIAGNOSIS — E1165 Type 2 diabetes mellitus with hyperglycemia: Secondary | ICD-10-CM | POA: Diagnosis not present

## 2021-09-20 DIAGNOSIS — Z6826 Body mass index (BMI) 26.0-26.9, adult: Secondary | ICD-10-CM | POA: Diagnosis not present

## 2021-09-20 DIAGNOSIS — L219 Seborrheic dermatitis, unspecified: Secondary | ICD-10-CM | POA: Diagnosis not present

## 2021-09-20 DIAGNOSIS — I10 Essential (primary) hypertension: Secondary | ICD-10-CM | POA: Diagnosis not present

## 2021-10-18 DIAGNOSIS — I10 Essential (primary) hypertension: Secondary | ICD-10-CM | POA: Diagnosis not present

## 2021-10-18 DIAGNOSIS — Z299 Encounter for prophylactic measures, unspecified: Secondary | ICD-10-CM | POA: Diagnosis not present

## 2021-10-18 DIAGNOSIS — M791 Myalgia, unspecified site: Secondary | ICD-10-CM | POA: Diagnosis not present

## 2021-10-18 DIAGNOSIS — M549 Dorsalgia, unspecified: Secondary | ICD-10-CM | POA: Diagnosis not present

## 2021-10-18 DIAGNOSIS — R739 Hyperglycemia, unspecified: Secondary | ICD-10-CM | POA: Diagnosis not present

## 2021-11-19 IMAGING — DX DG CHEST 1V PORT
2 series · 2 of 2 positions shown · non-contrast
Comparison: 04/13/2020

CLINICAL DATA: Fell, left hip pain and fracture

EXAM:
PORTABLE CHEST 1 VIEW

[chest ap (1 of 2)]
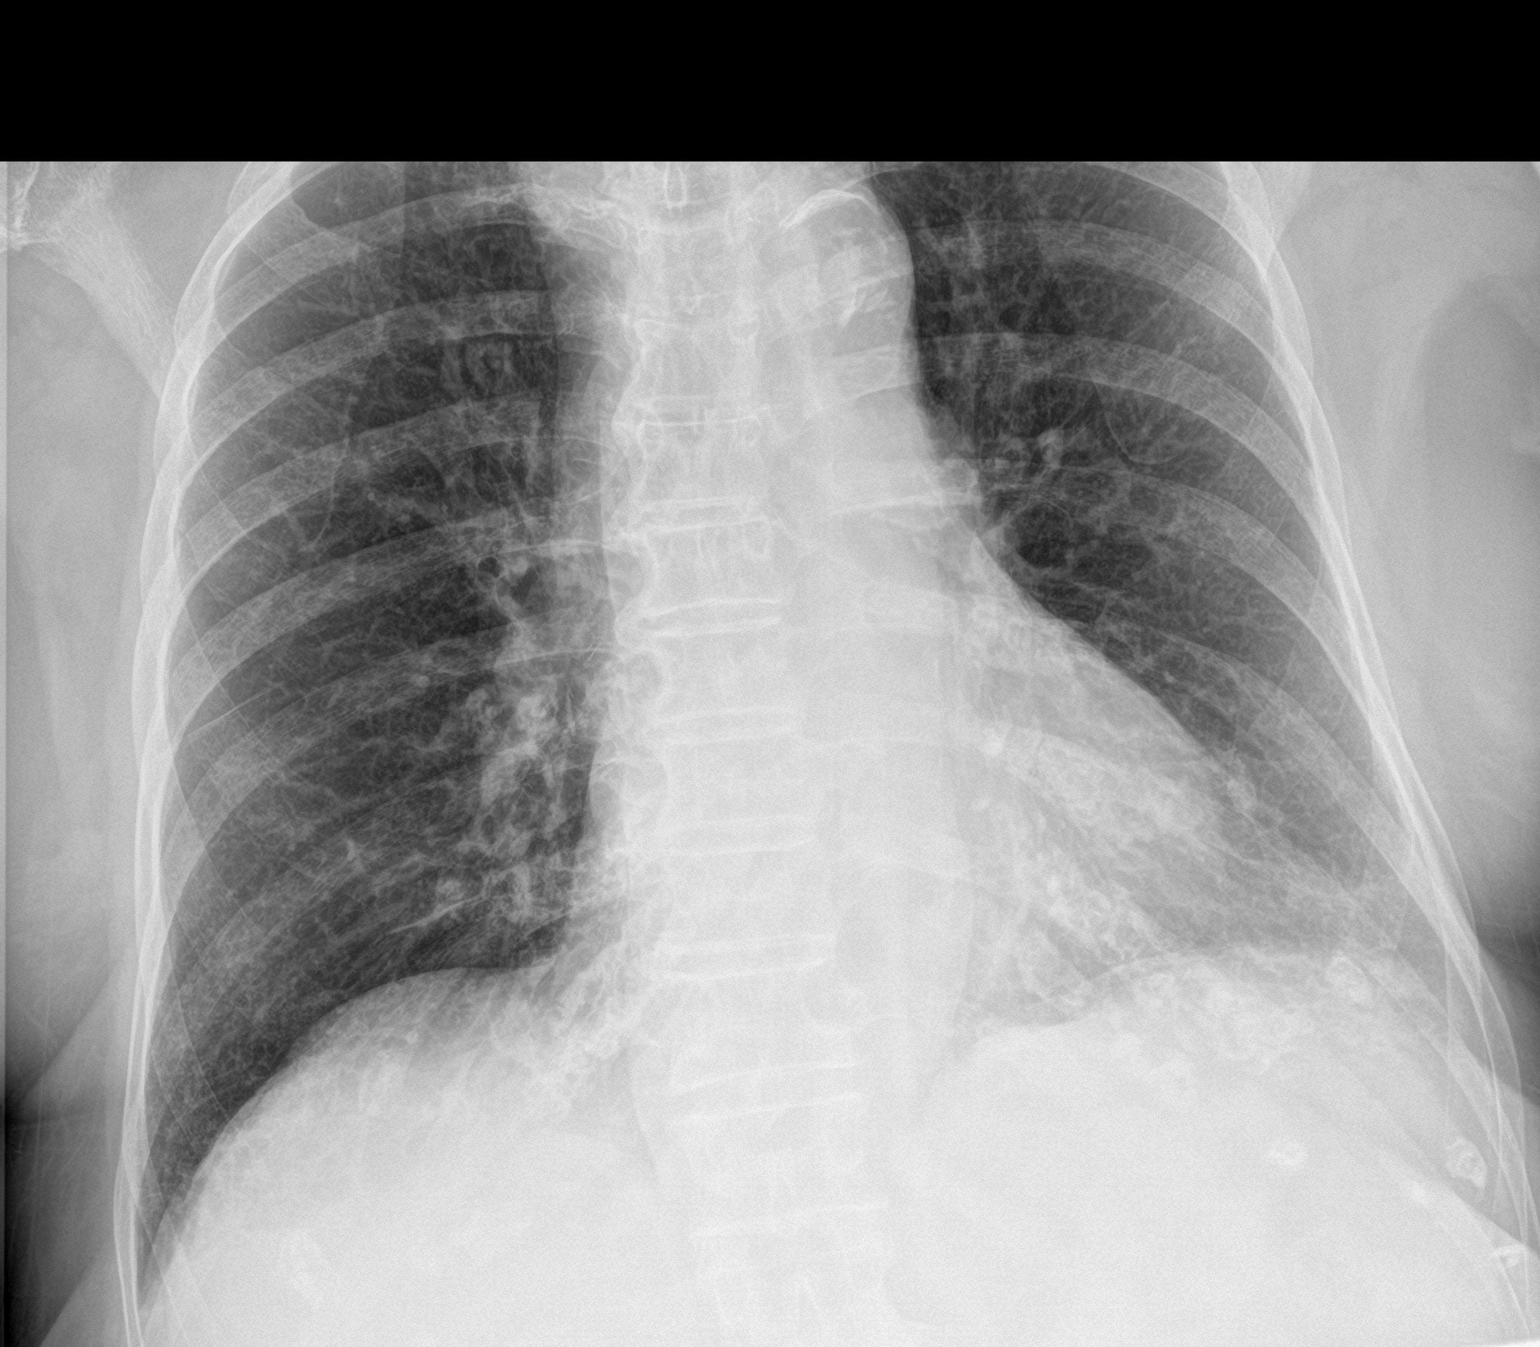

[chest ap (2 of 2)]
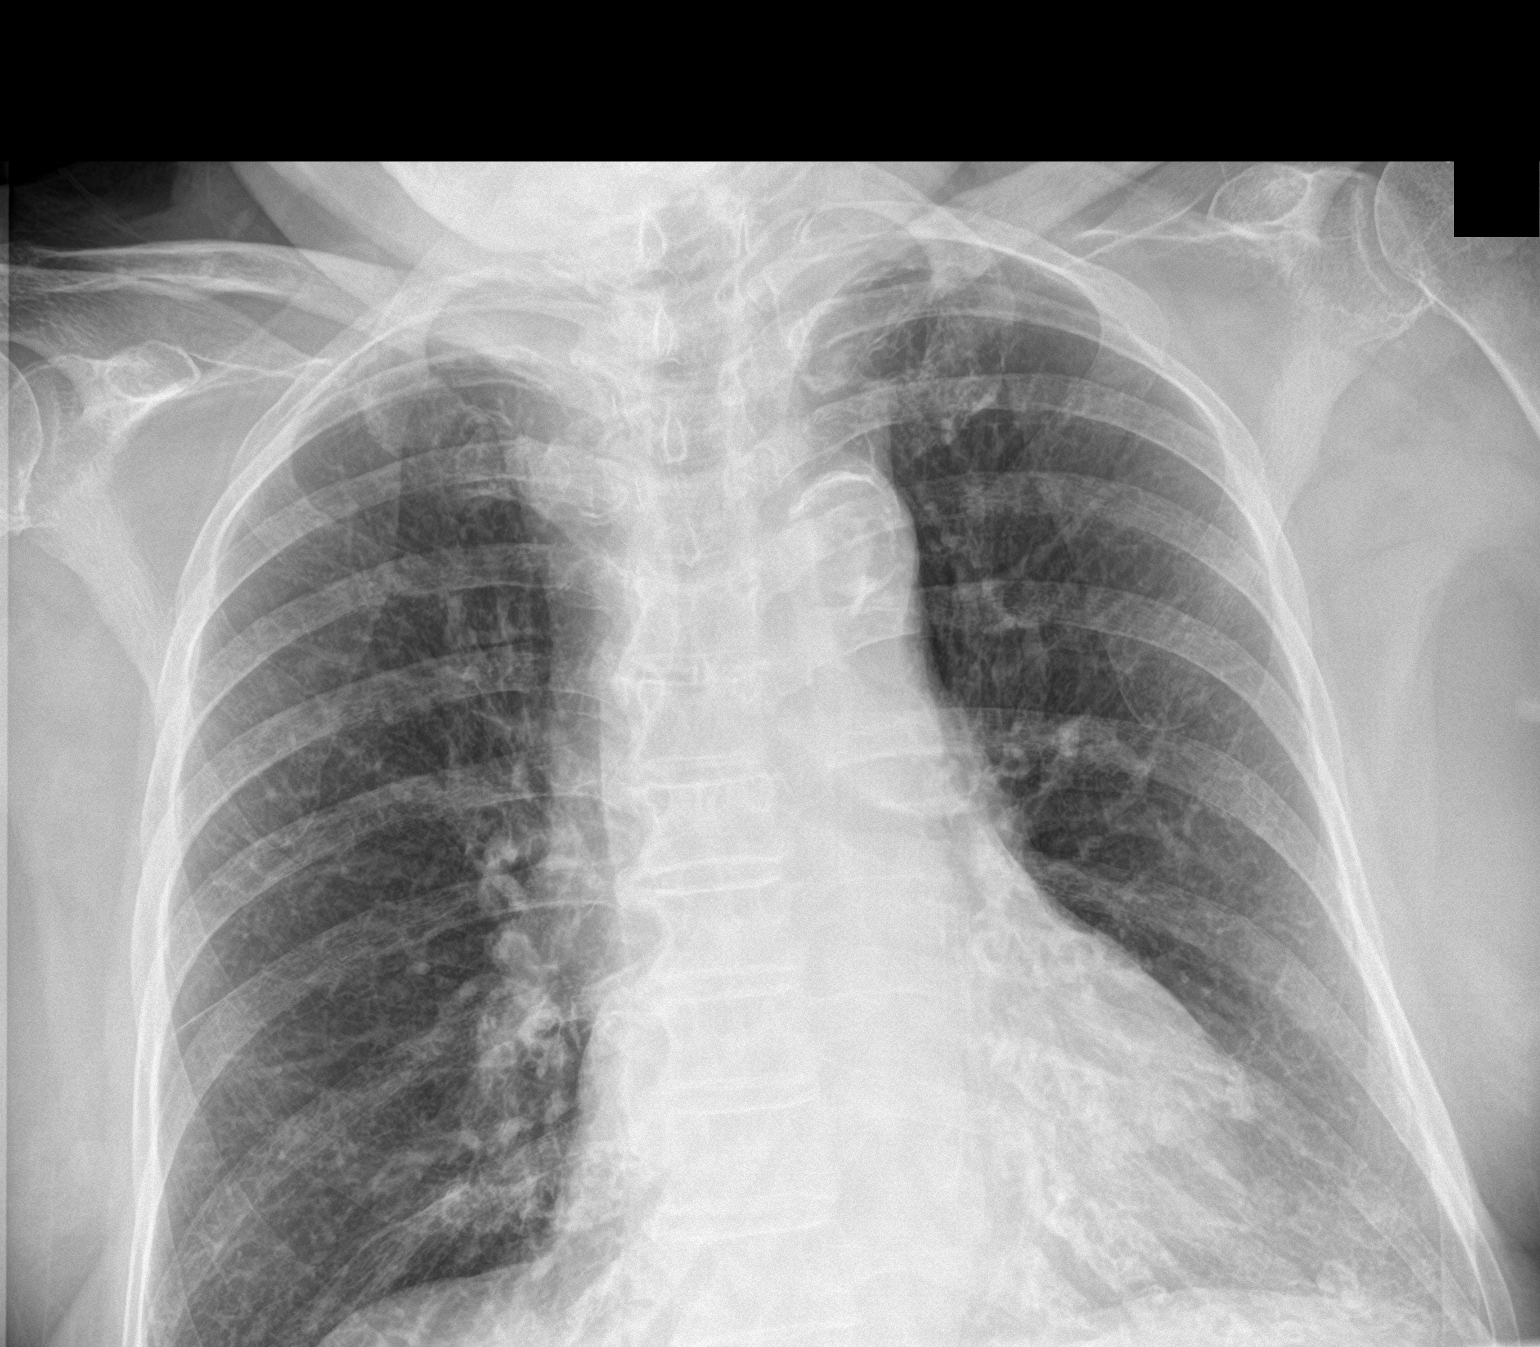

[2 of 2 positions shown; findings below may reference images not displayed]

FINDINGS: 2 frontal views of the chest demonstrate an unremarkable cardiac
silhouette. Stable atherosclerosis of the aortic arch. No airspace
disease, effusion, or pneumothorax. No acute bony abnormalities.
IMPRESSION: 1. Stable chest, no acute process.

## 2021-11-19 IMAGING — DX DG HIP (WITH OR WITHOUT PELVIS) 2-3V*L*
3 series · 3 of 3 positions shown · non-contrast
Comparison: None.

CLINICAL DATA: Pain status post fall

EXAM:
DG HIP (WITH OR WITHOUT PELVIS) 2-3V LEFT

[pelvis ap]
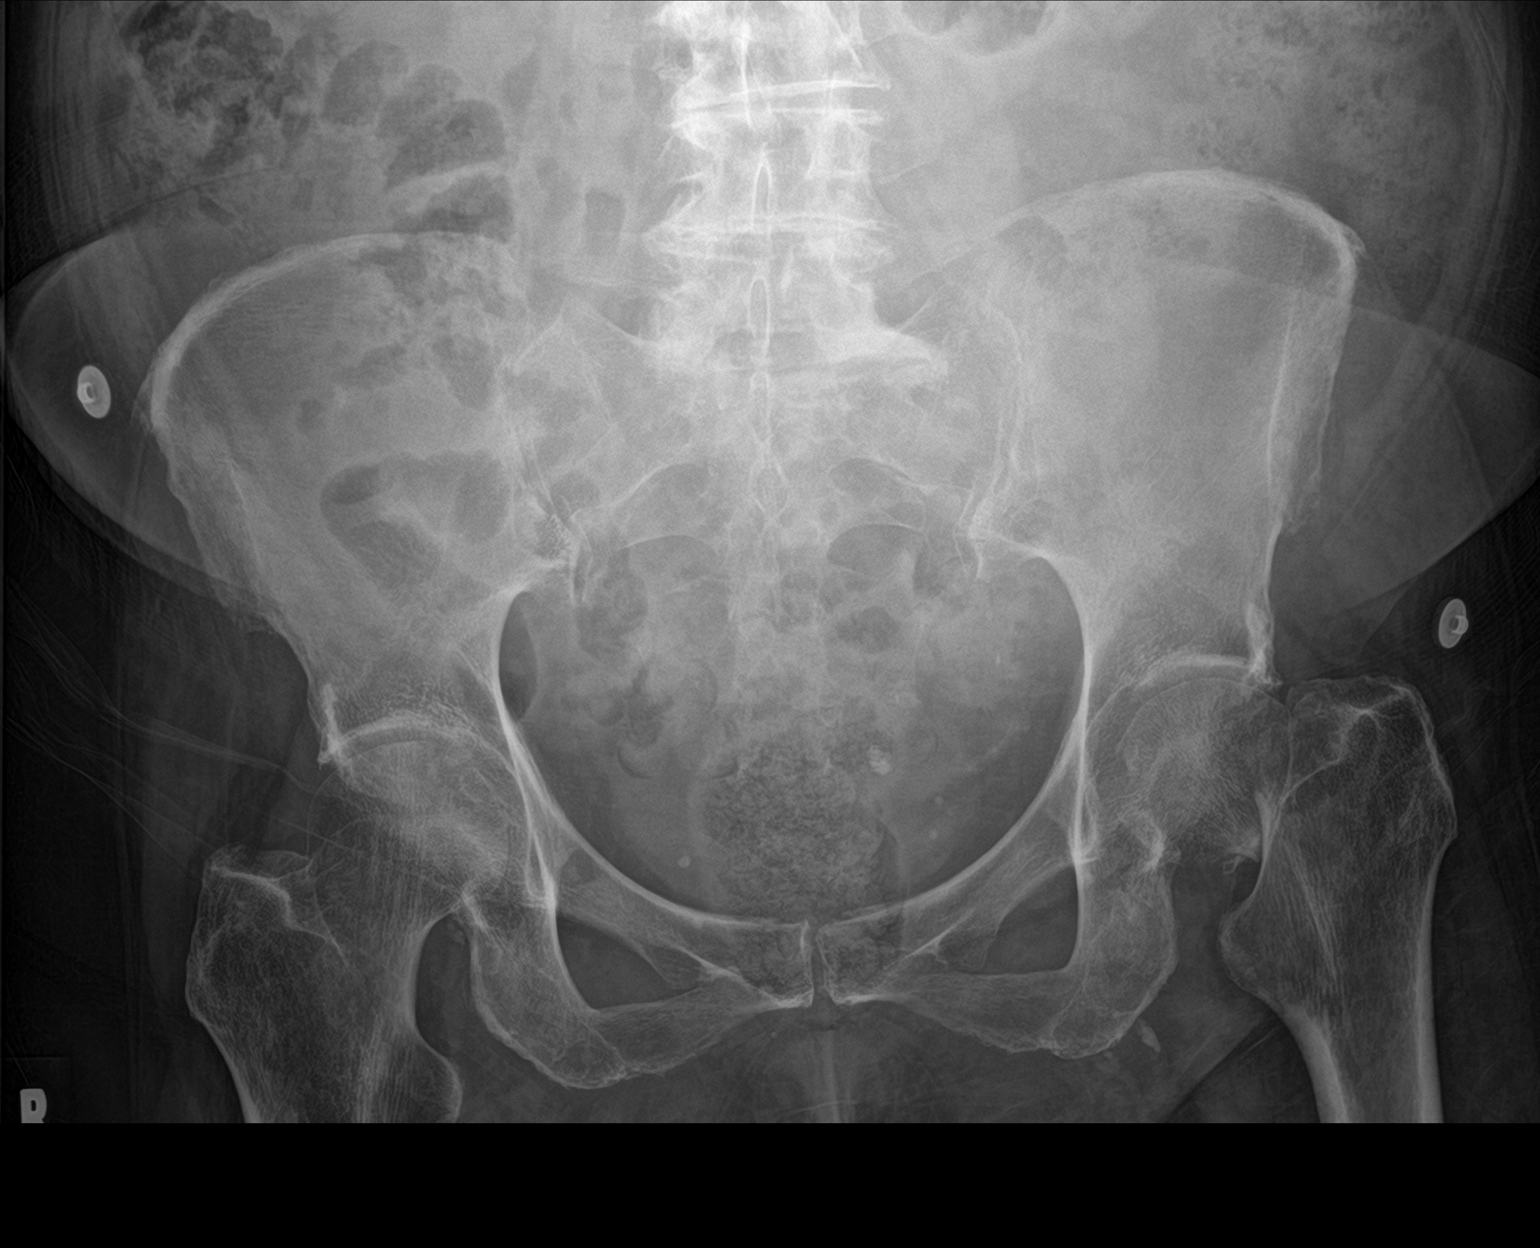

[hip ap]
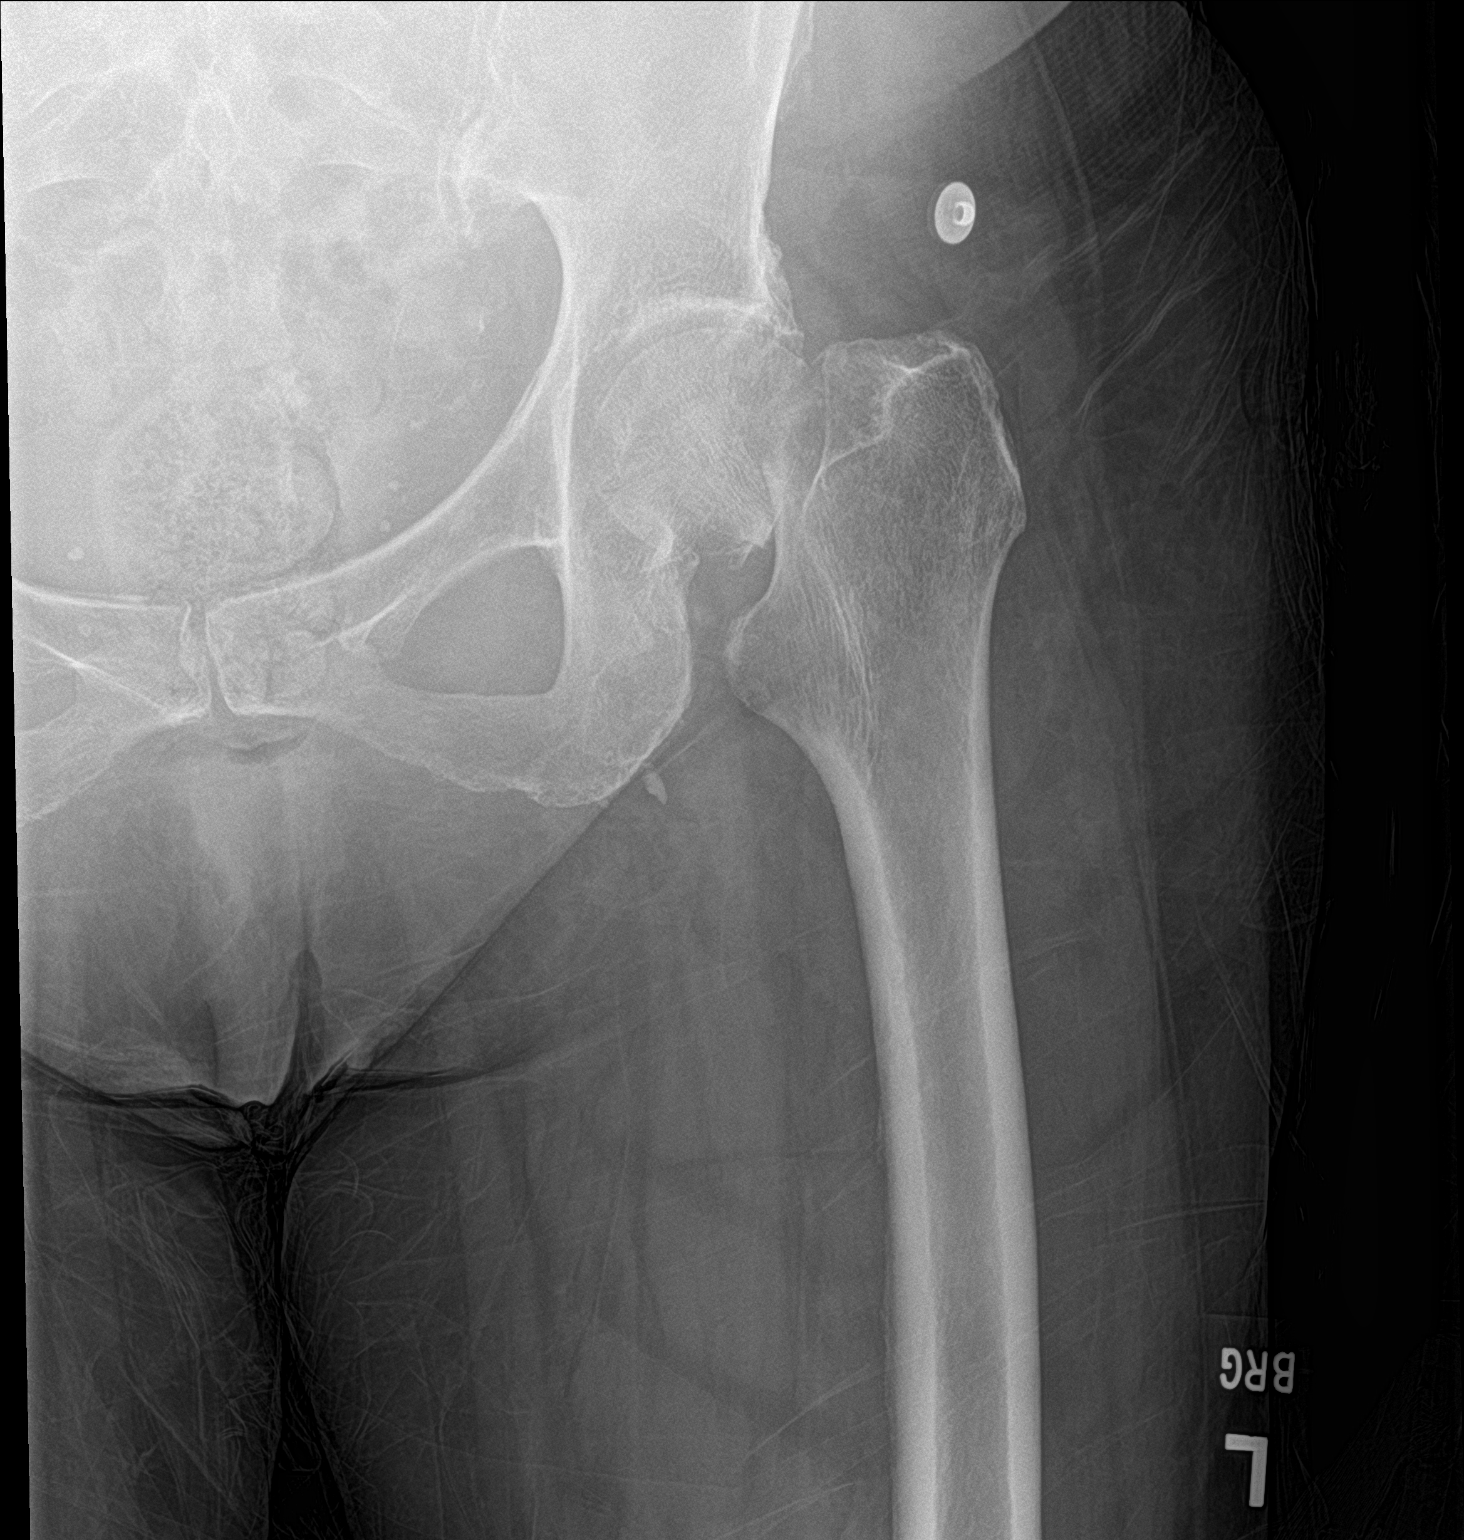

[hip lat xtable left]
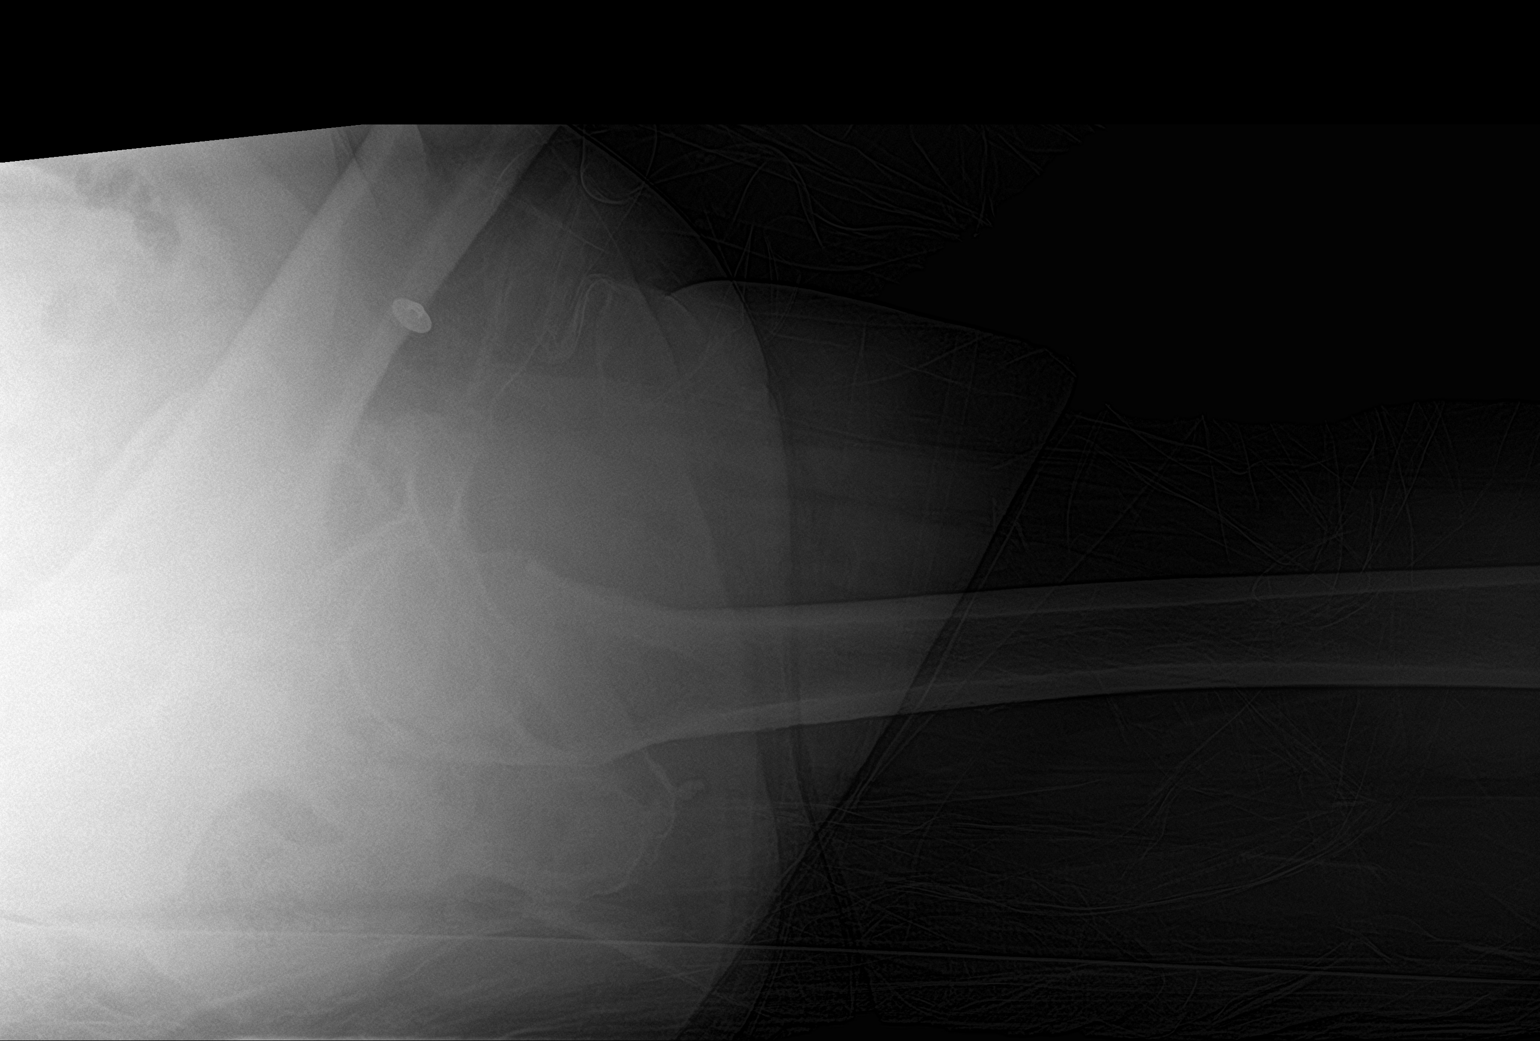

[3 of 3 positions shown; findings below may reference images not displayed]

FINDINGS: There is acute displaced transcervical fracture of the proximal left
femur. No dislocation. There are degenerative changes of the hips.
There is diffuse osteopenia.
IMPRESSION: Acute displaced fracture of the proximal left femur.

## 2021-11-22 DIAGNOSIS — L280 Lichen simplex chronicus: Secondary | ICD-10-CM | POA: Diagnosis not present

## 2021-11-22 DIAGNOSIS — L570 Actinic keratosis: Secondary | ICD-10-CM | POA: Diagnosis not present

## 2021-12-04 DIAGNOSIS — Z23 Encounter for immunization: Secondary | ICD-10-CM | POA: Diagnosis not present

## 2021-12-24 DIAGNOSIS — Z880 Allergy status to penicillin: Secondary | ICD-10-CM | POA: Diagnosis not present

## 2021-12-24 DIAGNOSIS — Z888 Allergy status to other drugs, medicaments and biological substances status: Secondary | ICD-10-CM | POA: Diagnosis not present

## 2021-12-24 DIAGNOSIS — Z79899 Other long term (current) drug therapy: Secondary | ICD-10-CM | POA: Diagnosis not present

## 2021-12-24 DIAGNOSIS — Z882 Allergy status to sulfonamides status: Secondary | ICD-10-CM | POA: Diagnosis not present

## 2021-12-24 DIAGNOSIS — I160 Hypertensive urgency: Secondary | ICD-10-CM | POA: Diagnosis not present

## 2021-12-24 DIAGNOSIS — N189 Chronic kidney disease, unspecified: Secondary | ICD-10-CM | POA: Diagnosis not present

## 2021-12-24 DIAGNOSIS — E785 Hyperlipidemia, unspecified: Secondary | ICD-10-CM | POA: Diagnosis not present

## 2021-12-24 DIAGNOSIS — I10 Essential (primary) hypertension: Secondary | ICD-10-CM | POA: Diagnosis not present

## 2021-12-24 DIAGNOSIS — E119 Type 2 diabetes mellitus without complications: Secondary | ICD-10-CM | POA: Diagnosis not present

## 2021-12-24 DIAGNOSIS — I129 Hypertensive chronic kidney disease with stage 1 through stage 4 chronic kidney disease, or unspecified chronic kidney disease: Secondary | ICD-10-CM | POA: Diagnosis not present

## 2021-12-24 DIAGNOSIS — F419 Anxiety disorder, unspecified: Secondary | ICD-10-CM | POA: Diagnosis not present

## 2021-12-26 DIAGNOSIS — Z6826 Body mass index (BMI) 26.0-26.9, adult: Secondary | ICD-10-CM | POA: Diagnosis not present

## 2021-12-26 DIAGNOSIS — Z789 Other specified health status: Secondary | ICD-10-CM | POA: Diagnosis not present

## 2021-12-26 DIAGNOSIS — I10 Essential (primary) hypertension: Secondary | ICD-10-CM | POA: Diagnosis not present

## 2021-12-26 DIAGNOSIS — Z299 Encounter for prophylactic measures, unspecified: Secondary | ICD-10-CM | POA: Diagnosis not present

## 2021-12-26 DIAGNOSIS — E1122 Type 2 diabetes mellitus with diabetic chronic kidney disease: Secondary | ICD-10-CM | POA: Diagnosis not present

## 2021-12-26 DIAGNOSIS — E1165 Type 2 diabetes mellitus with hyperglycemia: Secondary | ICD-10-CM | POA: Diagnosis not present

## 2021-12-26 DIAGNOSIS — N183 Chronic kidney disease, stage 3 unspecified: Secondary | ICD-10-CM | POA: Diagnosis not present

## 2022-02-02 DIAGNOSIS — D692 Other nonthrombocytopenic purpura: Secondary | ICD-10-CM | POA: Diagnosis not present

## 2022-02-02 DIAGNOSIS — Z6827 Body mass index (BMI) 27.0-27.9, adult: Secondary | ICD-10-CM | POA: Diagnosis not present

## 2022-02-02 DIAGNOSIS — Z299 Encounter for prophylactic measures, unspecified: Secondary | ICD-10-CM | POA: Diagnosis not present

## 2022-02-02 DIAGNOSIS — R609 Edema, unspecified: Secondary | ICD-10-CM | POA: Diagnosis not present

## 2022-02-02 DIAGNOSIS — L509 Urticaria, unspecified: Secondary | ICD-10-CM | POA: Diagnosis not present

## 2022-02-02 DIAGNOSIS — I10 Essential (primary) hypertension: Secondary | ICD-10-CM | POA: Diagnosis not present

## 2022-02-15 DIAGNOSIS — L308 Other specified dermatitis: Secondary | ICD-10-CM | POA: Diagnosis not present

## 2022-02-16 DIAGNOSIS — E7800 Pure hypercholesterolemia, unspecified: Secondary | ICD-10-CM | POA: Diagnosis not present

## 2022-02-16 DIAGNOSIS — I10 Essential (primary) hypertension: Secondary | ICD-10-CM | POA: Diagnosis not present

## 2022-02-16 DIAGNOSIS — Z6827 Body mass index (BMI) 27.0-27.9, adult: Secondary | ICD-10-CM | POA: Diagnosis not present

## 2022-02-16 DIAGNOSIS — E1129 Type 2 diabetes mellitus with other diabetic kidney complication: Secondary | ICD-10-CM | POA: Diagnosis not present

## 2022-02-16 DIAGNOSIS — R609 Edema, unspecified: Secondary | ICD-10-CM | POA: Diagnosis not present

## 2022-02-16 DIAGNOSIS — Z299 Encounter for prophylactic measures, unspecified: Secondary | ICD-10-CM | POA: Diagnosis not present

## 2022-02-17 DIAGNOSIS — Z880 Allergy status to penicillin: Secondary | ICD-10-CM | POA: Diagnosis not present

## 2022-02-17 DIAGNOSIS — Z888 Allergy status to other drugs, medicaments and biological substances status: Secondary | ICD-10-CM | POA: Diagnosis not present

## 2022-02-17 DIAGNOSIS — F32A Depression, unspecified: Secondary | ICD-10-CM | POA: Diagnosis not present

## 2022-02-17 DIAGNOSIS — E1122 Type 2 diabetes mellitus with diabetic chronic kidney disease: Secondary | ICD-10-CM | POA: Diagnosis not present

## 2022-02-17 DIAGNOSIS — S51811A Laceration without foreign body of right forearm, initial encounter: Secondary | ICD-10-CM | POA: Diagnosis not present

## 2022-02-17 DIAGNOSIS — I129 Hypertensive chronic kidney disease with stage 1 through stage 4 chronic kidney disease, or unspecified chronic kidney disease: Secondary | ICD-10-CM | POA: Diagnosis not present

## 2022-02-17 DIAGNOSIS — W0110XA Fall on same level from slipping, tripping and stumbling with subsequent striking against unspecified object, initial encounter: Secondary | ICD-10-CM | POA: Diagnosis not present

## 2022-02-17 DIAGNOSIS — Z79899 Other long term (current) drug therapy: Secondary | ICD-10-CM | POA: Diagnosis not present

## 2022-02-17 DIAGNOSIS — S81012A Laceration without foreign body, left knee, initial encounter: Secondary | ICD-10-CM | POA: Diagnosis not present

## 2022-02-17 DIAGNOSIS — I10 Essential (primary) hypertension: Secondary | ICD-10-CM | POA: Diagnosis not present

## 2022-02-17 DIAGNOSIS — S59911A Unspecified injury of right forearm, initial encounter: Secondary | ICD-10-CM | POA: Diagnosis not present

## 2022-02-17 DIAGNOSIS — E785 Hyperlipidemia, unspecified: Secondary | ICD-10-CM | POA: Diagnosis not present

## 2022-02-17 DIAGNOSIS — Z882 Allergy status to sulfonamides status: Secondary | ICD-10-CM | POA: Diagnosis not present

## 2022-02-17 DIAGNOSIS — S51812A Laceration without foreign body of left forearm, initial encounter: Secondary | ICD-10-CM | POA: Diagnosis not present

## 2022-02-17 DIAGNOSIS — N189 Chronic kidney disease, unspecified: Secondary | ICD-10-CM | POA: Diagnosis not present

## 2022-02-17 DIAGNOSIS — S59912A Unspecified injury of left forearm, initial encounter: Secondary | ICD-10-CM | POA: Diagnosis not present

## 2022-02-17 DIAGNOSIS — S81011A Laceration without foreign body, right knee, initial encounter: Secondary | ICD-10-CM | POA: Diagnosis not present

## 2022-02-17 DIAGNOSIS — L309 Dermatitis, unspecified: Secondary | ICD-10-CM | POA: Diagnosis not present

## 2022-03-01 DIAGNOSIS — I872 Venous insufficiency (chronic) (peripheral): Secondary | ICD-10-CM | POA: Diagnosis not present

## 2022-03-01 DIAGNOSIS — L308 Other specified dermatitis: Secondary | ICD-10-CM | POA: Diagnosis not present

## 2022-04-05 DIAGNOSIS — Z299 Encounter for prophylactic measures, unspecified: Secondary | ICD-10-CM | POA: Diagnosis not present

## 2022-04-05 DIAGNOSIS — I10 Essential (primary) hypertension: Secondary | ICD-10-CM | POA: Diagnosis not present

## 2022-04-05 DIAGNOSIS — E1165 Type 2 diabetes mellitus with hyperglycemia: Secondary | ICD-10-CM | POA: Diagnosis not present

## 2022-04-05 DIAGNOSIS — L308 Other specified dermatitis: Secondary | ICD-10-CM | POA: Diagnosis not present

## 2022-04-05 DIAGNOSIS — L509 Urticaria, unspecified: Secondary | ICD-10-CM | POA: Diagnosis not present

## 2022-04-13 ENCOUNTER — Encounter: Payer: Self-pay | Admitting: Orthopedic Surgery

## 2022-04-13 ENCOUNTER — Ambulatory Visit (INDEPENDENT_AMBULATORY_CARE_PROVIDER_SITE_OTHER): Payer: Medicare Other

## 2022-04-13 ENCOUNTER — Ambulatory Visit (INDEPENDENT_AMBULATORY_CARE_PROVIDER_SITE_OTHER): Payer: Medicare Other | Admitting: Orthopedic Surgery

## 2022-04-13 DIAGNOSIS — S72002D Fracture of unspecified part of neck of left femur, subsequent encounter for closed fracture with routine healing: Secondary | ICD-10-CM | POA: Diagnosis not present

## 2022-04-13 NOTE — Progress Notes (Signed)
Orthopaedic Postop Note ? ?Assessment: ?Trinitie Mcgirr is a 88 y.o. female s/p cemented Left hip hemiarthroplasty ? ?DOS: 04/18/21 ? ?Plan: ?Mrs. Deguia continue to do well.  She reports all approximately 1 month ago, without specific injury.  However, since then, she feels as though her gait has been off.  Single view of the right hip was evaluated on x-ray today.  No acute injuries are noted.  Left hip has healed well.  X-rays are stable.  I have urged her to continue walking with the assistance of a cane or a walker, to limit the chance of falling.  She states her understanding.  If she has any further issues, I have asked her to return to clinic.  Otherwise, follow-up as needed. ? ? ?Follow-up: ?Return if symptoms worsen or fail to improve. ?XR at next visit: AP pelvis and Left hip ? ?Subjective: ? ?Chief Complaint  ?Patient presents with  ? Follow-up  ?  Left hip surg f/u DOS 04/18/21, doing well.  [place] going into [employer] about  1 month ago.  Was taken to ED by ambulance.  Ever since that fall, seems to be off gait.  Wants to have you check the right hip by xray.    ? ? ?History of Present Illness: ?Jaimie Pippins is a 88 y.o. female who presents following the above stated procedure.  Surgery was approximately 1 year ago.  She has done very well in recovery.  She has no issues with her left hip.  She states that she was walking into a store, about 1 month ago, when she fell.  She was taken to the hospital via EMS.  Radiographs were negative.  Since then, she has noticed that her balance has been a little bit off.  She has some pain in the lateral right hip.  No prior x-rays of the right hip.  Otherwise, her health has remained stable. ? ? ?Review of Systems: ?No fevers or chills ?No numbness or tingling ?No Chest Pain ?No shortness of breath ? ? ?Objective: ?There were no vitals taken for this visit. ? ?Physical Exam: ? ?Alert and oriented.  No acute distress. ? ?Surgical incisions are healing well.  No surrounding  erythema or drainage. ? ?She walks without an assistive device.  Shortened stride length on the right.  No pain with axial loading on the left.  She tolerates gentle range of motion of the left.  No tenderness to palpation of the left lateral hip.  Does are warm and well-perfused.  Sensation is intact over the dorsum of her foot. ? ?IMAGING: ?I personally ordered and reviewed the following images: ? ?AP pelvis and left hip x-rays were obtained in clinic today.  There is a single view of the right hip.  No acute injuries of the right hip.  Left hip demonstrates a well-positioned cemented hemiarthroplasty.  No evidence of subsidence of the implant.  There is no lucency around the prosthesis. ? ?Impression: Left hip hemiarthroplasty in stable position. ? ?Mordecai Rasmussen, MD ?04/13/2022 ?9:59 AM ? ? ?

## 2022-04-16 DIAGNOSIS — Z6826 Body mass index (BMI) 26.0-26.9, adult: Secondary | ICD-10-CM | POA: Diagnosis not present

## 2022-04-16 DIAGNOSIS — Z299 Encounter for prophylactic measures, unspecified: Secondary | ICD-10-CM | POA: Diagnosis not present

## 2022-04-16 DIAGNOSIS — Z713 Dietary counseling and surveillance: Secondary | ICD-10-CM | POA: Diagnosis not present

## 2022-04-16 DIAGNOSIS — I10 Essential (primary) hypertension: Secondary | ICD-10-CM | POA: Diagnosis not present

## 2022-06-01 DIAGNOSIS — I10 Essential (primary) hypertension: Secondary | ICD-10-CM | POA: Diagnosis not present

## 2022-06-01 DIAGNOSIS — E1165 Type 2 diabetes mellitus with hyperglycemia: Secondary | ICD-10-CM | POA: Diagnosis not present

## 2022-06-01 DIAGNOSIS — Z299 Encounter for prophylactic measures, unspecified: Secondary | ICD-10-CM | POA: Diagnosis not present

## 2022-06-04 DIAGNOSIS — H40012 Open angle with borderline findings, low risk, left eye: Secondary | ICD-10-CM | POA: Diagnosis not present

## 2022-07-09 DIAGNOSIS — I10 Essential (primary) hypertension: Secondary | ICD-10-CM | POA: Diagnosis not present

## 2022-07-09 DIAGNOSIS — N183 Chronic kidney disease, stage 3 unspecified: Secondary | ICD-10-CM | POA: Diagnosis not present

## 2022-07-09 DIAGNOSIS — E1165 Type 2 diabetes mellitus with hyperglycemia: Secondary | ICD-10-CM | POA: Diagnosis not present

## 2022-07-09 DIAGNOSIS — E1122 Type 2 diabetes mellitus with diabetic chronic kidney disease: Secondary | ICD-10-CM | POA: Diagnosis not present

## 2022-07-09 DIAGNOSIS — Z299 Encounter for prophylactic measures, unspecified: Secondary | ICD-10-CM | POA: Diagnosis not present

## 2022-07-09 DIAGNOSIS — Z6825 Body mass index (BMI) 25.0-25.9, adult: Secondary | ICD-10-CM | POA: Diagnosis not present

## 2022-08-06 DIAGNOSIS — L509 Urticaria, unspecified: Secondary | ICD-10-CM | POA: Diagnosis not present

## 2022-08-06 DIAGNOSIS — E7800 Pure hypercholesterolemia, unspecified: Secondary | ICD-10-CM | POA: Diagnosis not present

## 2022-08-06 DIAGNOSIS — R609 Edema, unspecified: Secondary | ICD-10-CM | POA: Diagnosis not present

## 2022-08-06 DIAGNOSIS — L0390 Cellulitis, unspecified: Secondary | ICD-10-CM | POA: Diagnosis not present

## 2022-08-06 DIAGNOSIS — Z299 Encounter for prophylactic measures, unspecified: Secondary | ICD-10-CM | POA: Diagnosis not present

## 2022-08-23 DIAGNOSIS — I872 Venous insufficiency (chronic) (peripheral): Secondary | ICD-10-CM | POA: Diagnosis not present

## 2022-08-23 DIAGNOSIS — L308 Other specified dermatitis: Secondary | ICD-10-CM | POA: Diagnosis not present

## 2022-09-04 DIAGNOSIS — Z299 Encounter for prophylactic measures, unspecified: Secondary | ICD-10-CM | POA: Diagnosis not present

## 2022-09-04 DIAGNOSIS — Z1339 Encounter for screening examination for other mental health and behavioral disorders: Secondary | ICD-10-CM | POA: Diagnosis not present

## 2022-09-04 DIAGNOSIS — I10 Essential (primary) hypertension: Secondary | ICD-10-CM | POA: Diagnosis not present

## 2022-09-04 DIAGNOSIS — Z1331 Encounter for screening for depression: Secondary | ICD-10-CM | POA: Diagnosis not present

## 2022-09-04 DIAGNOSIS — Z23 Encounter for immunization: Secondary | ICD-10-CM | POA: Diagnosis not present

## 2022-09-04 DIAGNOSIS — E559 Vitamin D deficiency, unspecified: Secondary | ICD-10-CM | POA: Diagnosis not present

## 2022-09-04 DIAGNOSIS — Z0000 Encounter for general adult medical examination without abnormal findings: Secondary | ICD-10-CM | POA: Diagnosis not present

## 2022-09-04 DIAGNOSIS — Z6826 Body mass index (BMI) 26.0-26.9, adult: Secondary | ICD-10-CM | POA: Diagnosis not present

## 2022-09-04 DIAGNOSIS — Z7189 Other specified counseling: Secondary | ICD-10-CM | POA: Diagnosis not present

## 2022-09-04 DIAGNOSIS — R5383 Other fatigue: Secondary | ICD-10-CM | POA: Diagnosis not present

## 2022-09-04 DIAGNOSIS — E7800 Pure hypercholesterolemia, unspecified: Secondary | ICD-10-CM | POA: Diagnosis not present

## 2022-09-04 DIAGNOSIS — Z79899 Other long term (current) drug therapy: Secondary | ICD-10-CM | POA: Diagnosis not present

## 2022-09-24 DIAGNOSIS — I10 Essential (primary) hypertension: Secondary | ICD-10-CM | POA: Diagnosis not present

## 2022-09-24 DIAGNOSIS — Z6826 Body mass index (BMI) 26.0-26.9, adult: Secondary | ICD-10-CM | POA: Diagnosis not present

## 2022-09-24 DIAGNOSIS — Z299 Encounter for prophylactic measures, unspecified: Secondary | ICD-10-CM | POA: Diagnosis not present

## 2022-09-24 DIAGNOSIS — Z713 Dietary counseling and surveillance: Secondary | ICD-10-CM | POA: Diagnosis not present

## 2022-09-27 DIAGNOSIS — L308 Other specified dermatitis: Secondary | ICD-10-CM | POA: Diagnosis not present

## 2022-09-27 DIAGNOSIS — I872 Venous insufficiency (chronic) (peripheral): Secondary | ICD-10-CM | POA: Diagnosis not present

## 2022-10-08 DIAGNOSIS — E1165 Type 2 diabetes mellitus with hyperglycemia: Secondary | ICD-10-CM | POA: Diagnosis not present

## 2022-10-08 DIAGNOSIS — I10 Essential (primary) hypertension: Secondary | ICD-10-CM | POA: Diagnosis not present

## 2022-10-08 DIAGNOSIS — Z713 Dietary counseling and surveillance: Secondary | ICD-10-CM | POA: Diagnosis not present

## 2022-10-08 DIAGNOSIS — Z299 Encounter for prophylactic measures, unspecified: Secondary | ICD-10-CM | POA: Diagnosis not present

## 2022-10-08 DIAGNOSIS — Z6825 Body mass index (BMI) 25.0-25.9, adult: Secondary | ICD-10-CM | POA: Diagnosis not present

## 2022-10-17 DIAGNOSIS — Z6825 Body mass index (BMI) 25.0-25.9, adult: Secondary | ICD-10-CM | POA: Diagnosis not present

## 2022-10-17 DIAGNOSIS — E1165 Type 2 diabetes mellitus with hyperglycemia: Secondary | ICD-10-CM | POA: Diagnosis not present

## 2022-10-17 DIAGNOSIS — Z713 Dietary counseling and surveillance: Secondary | ICD-10-CM | POA: Diagnosis not present

## 2022-10-17 DIAGNOSIS — I10 Essential (primary) hypertension: Secondary | ICD-10-CM | POA: Diagnosis not present

## 2022-10-17 DIAGNOSIS — Z299 Encounter for prophylactic measures, unspecified: Secondary | ICD-10-CM | POA: Diagnosis not present

## 2022-11-01 DIAGNOSIS — I10 Essential (primary) hypertension: Secondary | ICD-10-CM | POA: Diagnosis not present

## 2022-11-01 DIAGNOSIS — Z299 Encounter for prophylactic measures, unspecified: Secondary | ICD-10-CM | POA: Diagnosis not present

## 2022-11-01 DIAGNOSIS — R2681 Unsteadiness on feet: Secondary | ICD-10-CM | POA: Diagnosis not present

## 2022-11-06 DIAGNOSIS — Z299 Encounter for prophylactic measures, unspecified: Secondary | ICD-10-CM | POA: Diagnosis not present

## 2022-11-06 DIAGNOSIS — E7800 Pure hypercholesterolemia, unspecified: Secondary | ICD-10-CM | POA: Diagnosis not present

## 2022-11-06 DIAGNOSIS — I10 Essential (primary) hypertension: Secondary | ICD-10-CM | POA: Diagnosis not present

## 2022-11-12 DIAGNOSIS — D485 Neoplasm of uncertain behavior of skin: Secondary | ICD-10-CM | POA: Diagnosis not present

## 2022-11-12 DIAGNOSIS — L309 Dermatitis, unspecified: Secondary | ICD-10-CM | POA: Diagnosis not present

## 2022-11-12 DIAGNOSIS — L989 Disorder of the skin and subcutaneous tissue, unspecified: Secondary | ICD-10-CM | POA: Diagnosis not present

## 2022-11-12 DIAGNOSIS — L299 Pruritus, unspecified: Secondary | ICD-10-CM | POA: Diagnosis not present

## 2022-11-16 DIAGNOSIS — Z299 Encounter for prophylactic measures, unspecified: Secondary | ICD-10-CM | POA: Diagnosis not present

## 2022-11-16 DIAGNOSIS — I10 Essential (primary) hypertension: Secondary | ICD-10-CM | POA: Diagnosis not present

## 2022-11-16 DIAGNOSIS — L509 Urticaria, unspecified: Secondary | ICD-10-CM | POA: Diagnosis not present

## 2022-11-16 DIAGNOSIS — Z713 Dietary counseling and surveillance: Secondary | ICD-10-CM | POA: Diagnosis not present

## 2022-11-16 DIAGNOSIS — Z6826 Body mass index (BMI) 26.0-26.9, adult: Secondary | ICD-10-CM | POA: Diagnosis not present

## 2022-11-21 DIAGNOSIS — E1165 Type 2 diabetes mellitus with hyperglycemia: Secondary | ICD-10-CM | POA: Diagnosis not present

## 2022-11-21 DIAGNOSIS — I10 Essential (primary) hypertension: Secondary | ICD-10-CM | POA: Diagnosis not present

## 2022-11-21 DIAGNOSIS — N183 Chronic kidney disease, stage 3 unspecified: Secondary | ICD-10-CM | POA: Diagnosis not present

## 2022-11-21 DIAGNOSIS — Z6826 Body mass index (BMI) 26.0-26.9, adult: Secondary | ICD-10-CM | POA: Diagnosis not present

## 2022-11-21 DIAGNOSIS — Z299 Encounter for prophylactic measures, unspecified: Secondary | ICD-10-CM | POA: Diagnosis not present

## 2023-01-01 DIAGNOSIS — E1165 Type 2 diabetes mellitus with hyperglycemia: Secondary | ICD-10-CM | POA: Diagnosis not present

## 2023-01-01 DIAGNOSIS — Z299 Encounter for prophylactic measures, unspecified: Secondary | ICD-10-CM | POA: Diagnosis not present

## 2023-01-01 DIAGNOSIS — I10 Essential (primary) hypertension: Secondary | ICD-10-CM | POA: Diagnosis not present

## 2023-01-14 DIAGNOSIS — E7800 Pure hypercholesterolemia, unspecified: Secondary | ICD-10-CM | POA: Diagnosis not present

## 2023-01-14 DIAGNOSIS — E1165 Type 2 diabetes mellitus with hyperglycemia: Secondary | ICD-10-CM | POA: Diagnosis not present

## 2023-01-14 DIAGNOSIS — I10 Essential (primary) hypertension: Secondary | ICD-10-CM | POA: Diagnosis not present

## 2023-01-14 DIAGNOSIS — Z299 Encounter for prophylactic measures, unspecified: Secondary | ICD-10-CM | POA: Diagnosis not present

## 2023-01-30 DIAGNOSIS — E1165 Type 2 diabetes mellitus with hyperglycemia: Secondary | ICD-10-CM | POA: Diagnosis not present

## 2023-01-30 DIAGNOSIS — N183 Chronic kidney disease, stage 3 unspecified: Secondary | ICD-10-CM | POA: Diagnosis not present

## 2023-01-30 DIAGNOSIS — I10 Essential (primary) hypertension: Secondary | ICD-10-CM | POA: Diagnosis not present

## 2023-01-30 DIAGNOSIS — L509 Urticaria, unspecified: Secondary | ICD-10-CM | POA: Diagnosis not present

## 2023-01-30 DIAGNOSIS — Z299 Encounter for prophylactic measures, unspecified: Secondary | ICD-10-CM | POA: Diagnosis not present

## 2023-02-11 DIAGNOSIS — L280 Lichen simplex chronicus: Secondary | ICD-10-CM | POA: Diagnosis not present

## 2023-03-01 DIAGNOSIS — Z6825 Body mass index (BMI) 25.0-25.9, adult: Secondary | ICD-10-CM | POA: Diagnosis not present

## 2023-03-01 DIAGNOSIS — R609 Edema, unspecified: Secondary | ICD-10-CM | POA: Diagnosis not present

## 2023-03-01 DIAGNOSIS — I10 Essential (primary) hypertension: Secondary | ICD-10-CM | POA: Diagnosis not present

## 2023-03-01 DIAGNOSIS — Z299 Encounter for prophylactic measures, unspecified: Secondary | ICD-10-CM | POA: Diagnosis not present

## 2023-09-16 DIAGNOSIS — Z0000 Encounter for general adult medical examination without abnormal findings: Secondary | ICD-10-CM | POA: Diagnosis not present

## 2023-09-16 DIAGNOSIS — R5383 Other fatigue: Secondary | ICD-10-CM | POA: Diagnosis not present

## 2023-09-16 DIAGNOSIS — E559 Vitamin D deficiency, unspecified: Secondary | ICD-10-CM | POA: Diagnosis not present

## 2023-09-16 DIAGNOSIS — Z7189 Other specified counseling: Secondary | ICD-10-CM | POA: Diagnosis not present

## 2023-09-16 DIAGNOSIS — Z1331 Encounter for screening for depression: Secondary | ICD-10-CM | POA: Diagnosis not present

## 2023-09-16 DIAGNOSIS — E7800 Pure hypercholesterolemia, unspecified: Secondary | ICD-10-CM | POA: Diagnosis not present

## 2023-09-16 DIAGNOSIS — E1122 Type 2 diabetes mellitus with diabetic chronic kidney disease: Secondary | ICD-10-CM | POA: Diagnosis not present

## 2023-09-16 DIAGNOSIS — I10 Essential (primary) hypertension: Secondary | ICD-10-CM | POA: Diagnosis not present

## 2023-09-16 DIAGNOSIS — Z1339 Encounter for screening examination for other mental health and behavioral disorders: Secondary | ICD-10-CM | POA: Diagnosis not present

## 2023-09-16 DIAGNOSIS — Z23 Encounter for immunization: Secondary | ICD-10-CM | POA: Diagnosis not present

## 2023-09-16 DIAGNOSIS — Z299 Encounter for prophylactic measures, unspecified: Secondary | ICD-10-CM | POA: Diagnosis not present

## 2023-09-16 DIAGNOSIS — Z79899 Other long term (current) drug therapy: Secondary | ICD-10-CM | POA: Diagnosis not present

## 2023-09-19 DIAGNOSIS — I10 Essential (primary) hypertension: Secondary | ICD-10-CM | POA: Diagnosis not present

## 2023-09-19 DIAGNOSIS — Z299 Encounter for prophylactic measures, unspecified: Secondary | ICD-10-CM | POA: Diagnosis not present

## 2023-09-19 DIAGNOSIS — R110 Nausea: Secondary | ICD-10-CM | POA: Diagnosis not present

## 2023-09-25 DIAGNOSIS — M1990 Unspecified osteoarthritis, unspecified site: Secondary | ICD-10-CM | POA: Diagnosis not present

## 2023-09-25 DIAGNOSIS — E1122 Type 2 diabetes mellitus with diabetic chronic kidney disease: Secondary | ICD-10-CM | POA: Diagnosis not present

## 2023-09-25 DIAGNOSIS — R112 Nausea with vomiting, unspecified: Secondary | ICD-10-CM | POA: Diagnosis not present

## 2023-09-25 DIAGNOSIS — N189 Chronic kidney disease, unspecified: Secondary | ICD-10-CM | POA: Diagnosis not present

## 2023-09-25 DIAGNOSIS — K529 Noninfective gastroenteritis and colitis, unspecified: Secondary | ICD-10-CM | POA: Diagnosis not present

## 2023-09-25 DIAGNOSIS — Z880 Allergy status to penicillin: Secondary | ICD-10-CM | POA: Diagnosis not present

## 2023-09-25 DIAGNOSIS — F32A Depression, unspecified: Secondary | ICD-10-CM | POA: Diagnosis not present

## 2023-09-25 DIAGNOSIS — E785 Hyperlipidemia, unspecified: Secondary | ICD-10-CM | POA: Diagnosis not present

## 2023-09-25 DIAGNOSIS — Z882 Allergy status to sulfonamides status: Secondary | ICD-10-CM | POA: Diagnosis not present

## 2023-09-25 DIAGNOSIS — I129 Hypertensive chronic kidney disease with stage 1 through stage 4 chronic kidney disease, or unspecified chronic kidney disease: Secondary | ICD-10-CM | POA: Diagnosis not present

## 2023-09-26 DIAGNOSIS — R110 Nausea: Secondary | ICD-10-CM | POA: Diagnosis not present

## 2023-09-26 DIAGNOSIS — Z299 Encounter for prophylactic measures, unspecified: Secondary | ICD-10-CM | POA: Diagnosis not present

## 2023-09-26 DIAGNOSIS — I10 Essential (primary) hypertension: Secondary | ICD-10-CM | POA: Diagnosis not present

## 2023-09-26 DIAGNOSIS — K529 Noninfective gastroenteritis and colitis, unspecified: Secondary | ICD-10-CM | POA: Diagnosis not present

## 2023-11-01 DIAGNOSIS — N183 Chronic kidney disease, stage 3 unspecified: Secondary | ICD-10-CM | POA: Diagnosis not present

## 2023-11-01 DIAGNOSIS — R059 Cough, unspecified: Secondary | ICD-10-CM | POA: Diagnosis not present

## 2023-11-01 DIAGNOSIS — Z299 Encounter for prophylactic measures, unspecified: Secondary | ICD-10-CM | POA: Diagnosis not present

## 2023-11-01 DIAGNOSIS — E1169 Type 2 diabetes mellitus with other specified complication: Secondary | ICD-10-CM | POA: Diagnosis not present

## 2023-11-01 DIAGNOSIS — I10 Essential (primary) hypertension: Secondary | ICD-10-CM | POA: Diagnosis not present

## 2023-12-02 DIAGNOSIS — E1169 Type 2 diabetes mellitus with other specified complication: Secondary | ICD-10-CM | POA: Diagnosis not present

## 2023-12-02 DIAGNOSIS — N183 Chronic kidney disease, stage 3 unspecified: Secondary | ICD-10-CM | POA: Diagnosis not present

## 2023-12-02 DIAGNOSIS — Z299 Encounter for prophylactic measures, unspecified: Secondary | ICD-10-CM | POA: Diagnosis not present

## 2023-12-02 DIAGNOSIS — I10 Essential (primary) hypertension: Secondary | ICD-10-CM | POA: Diagnosis not present

## 2024-01-06 DIAGNOSIS — Z299 Encounter for prophylactic measures, unspecified: Secondary | ICD-10-CM | POA: Diagnosis not present

## 2024-01-06 DIAGNOSIS — E1169 Type 2 diabetes mellitus with other specified complication: Secondary | ICD-10-CM | POA: Diagnosis not present

## 2024-01-06 DIAGNOSIS — I10 Essential (primary) hypertension: Secondary | ICD-10-CM | POA: Diagnosis not present

## 2024-04-10 DIAGNOSIS — L509 Urticaria, unspecified: Secondary | ICD-10-CM | POA: Diagnosis not present

## 2024-04-10 DIAGNOSIS — Z299 Encounter for prophylactic measures, unspecified: Secondary | ICD-10-CM | POA: Diagnosis not present

## 2024-04-10 DIAGNOSIS — N183 Chronic kidney disease, stage 3 unspecified: Secondary | ICD-10-CM | POA: Diagnosis not present

## 2024-04-10 DIAGNOSIS — I10 Essential (primary) hypertension: Secondary | ICD-10-CM | POA: Diagnosis not present

## 2024-04-10 DIAGNOSIS — E1169 Type 2 diabetes mellitus with other specified complication: Secondary | ICD-10-CM | POA: Diagnosis not present

## 2024-07-16 DIAGNOSIS — I10 Essential (primary) hypertension: Secondary | ICD-10-CM | POA: Diagnosis not present

## 2024-07-16 DIAGNOSIS — Z299 Encounter for prophylactic measures, unspecified: Secondary | ICD-10-CM | POA: Diagnosis not present

## 2024-07-16 DIAGNOSIS — N183 Chronic kidney disease, stage 3 unspecified: Secondary | ICD-10-CM | POA: Diagnosis not present

## 2024-07-16 DIAGNOSIS — E119 Type 2 diabetes mellitus without complications: Secondary | ICD-10-CM | POA: Diagnosis not present

## 2024-09-16 DIAGNOSIS — Z7189 Other specified counseling: Secondary | ICD-10-CM | POA: Diagnosis not present

## 2024-09-16 DIAGNOSIS — Z1331 Encounter for screening for depression: Secondary | ICD-10-CM | POA: Diagnosis not present

## 2024-09-16 DIAGNOSIS — Z1339 Encounter for screening examination for other mental health and behavioral disorders: Secondary | ICD-10-CM | POA: Diagnosis not present

## 2024-09-16 DIAGNOSIS — Z299 Encounter for prophylactic measures, unspecified: Secondary | ICD-10-CM | POA: Diagnosis not present

## 2024-09-16 DIAGNOSIS — Z0000 Encounter for general adult medical examination without abnormal findings: Secondary | ICD-10-CM | POA: Diagnosis not present

## 2024-09-16 DIAGNOSIS — R5383 Other fatigue: Secondary | ICD-10-CM | POA: Diagnosis not present

## 2024-09-16 DIAGNOSIS — Z23 Encounter for immunization: Secondary | ICD-10-CM | POA: Diagnosis not present

## 2024-09-16 DIAGNOSIS — Z79899 Other long term (current) drug therapy: Secondary | ICD-10-CM | POA: Diagnosis not present

## 2024-09-16 DIAGNOSIS — E559 Vitamin D deficiency, unspecified: Secondary | ICD-10-CM | POA: Diagnosis not present

## 2024-09-16 DIAGNOSIS — E7800 Pure hypercholesterolemia, unspecified: Secondary | ICD-10-CM | POA: Diagnosis not present

## 2024-10-30 DIAGNOSIS — Z299 Encounter for prophylactic measures, unspecified: Secondary | ICD-10-CM | POA: Diagnosis not present

## 2024-10-30 DIAGNOSIS — N183 Chronic kidney disease, stage 3 unspecified: Secondary | ICD-10-CM | POA: Diagnosis not present

## 2024-10-30 DIAGNOSIS — E119 Type 2 diabetes mellitus without complications: Secondary | ICD-10-CM | POA: Diagnosis not present

## 2024-10-30 DIAGNOSIS — I10 Essential (primary) hypertension: Secondary | ICD-10-CM | POA: Diagnosis not present

## 2024-11-29 DIAGNOSIS — I129 Hypertensive chronic kidney disease with stage 1 through stage 4 chronic kidney disease, or unspecified chronic kidney disease: Secondary | ICD-10-CM | POA: Diagnosis not present

## 2024-11-29 DIAGNOSIS — N1832 Chronic kidney disease, stage 3b: Secondary | ICD-10-CM | POA: Diagnosis not present

## 2024-11-29 DIAGNOSIS — J189 Pneumonia, unspecified organism: Secondary | ICD-10-CM | POA: Diagnosis not present

## 2024-11-29 DIAGNOSIS — R509 Fever, unspecified: Secondary | ICD-10-CM | POA: Diagnosis not present

## 2024-11-29 DIAGNOSIS — R911 Solitary pulmonary nodule: Secondary | ICD-10-CM | POA: Diagnosis not present

## 2024-11-29 DIAGNOSIS — R000 Tachycardia, unspecified: Secondary | ICD-10-CM | POA: Diagnosis not present

## 2024-11-29 DIAGNOSIS — R42 Dizziness and giddiness: Secondary | ICD-10-CM | POA: Diagnosis not present

## 2024-11-29 DIAGNOSIS — R4781 Slurred speech: Secondary | ICD-10-CM | POA: Diagnosis not present

## 2024-11-29 DIAGNOSIS — I10 Essential (primary) hypertension: Secondary | ICD-10-CM | POA: Diagnosis not present

## 2024-11-29 DIAGNOSIS — E1122 Type 2 diabetes mellitus with diabetic chronic kidney disease: Secondary | ICD-10-CM | POA: Diagnosis not present

## 2024-11-29 DIAGNOSIS — R918 Other nonspecific abnormal finding of lung field: Secondary | ICD-10-CM | POA: Diagnosis not present

## 2024-11-30 DIAGNOSIS — Z792 Long term (current) use of antibiotics: Secondary | ICD-10-CM | POA: Diagnosis not present

## 2024-11-30 DIAGNOSIS — E785 Hyperlipidemia, unspecified: Secondary | ICD-10-CM | POA: Diagnosis not present

## 2024-11-30 DIAGNOSIS — R7989 Other specified abnormal findings of blood chemistry: Secondary | ICD-10-CM | POA: Diagnosis not present

## 2024-11-30 DIAGNOSIS — N1832 Chronic kidney disease, stage 3b: Secondary | ICD-10-CM | POA: Diagnosis not present

## 2024-11-30 DIAGNOSIS — J189 Pneumonia, unspecified organism: Secondary | ICD-10-CM | POA: Diagnosis not present

## 2024-11-30 DIAGNOSIS — F32A Depression, unspecified: Secondary | ICD-10-CM | POA: Diagnosis not present

## 2024-11-30 DIAGNOSIS — E1122 Type 2 diabetes mellitus with diabetic chronic kidney disease: Secondary | ICD-10-CM | POA: Diagnosis not present

## 2024-11-30 DIAGNOSIS — Z79899 Other long term (current) drug therapy: Secondary | ICD-10-CM | POA: Diagnosis not present

## 2024-11-30 DIAGNOSIS — Z9981 Dependence on supplemental oxygen: Secondary | ICD-10-CM | POA: Diagnosis not present

## 2024-11-30 DIAGNOSIS — R8290 Unspecified abnormal findings in urine: Secondary | ICD-10-CM | POA: Diagnosis not present

## 2024-11-30 DIAGNOSIS — I129 Hypertensive chronic kidney disease with stage 1 through stage 4 chronic kidney disease, or unspecified chronic kidney disease: Secondary | ICD-10-CM | POA: Diagnosis not present

## 2024-12-01 DIAGNOSIS — Z792 Long term (current) use of antibiotics: Secondary | ICD-10-CM | POA: Diagnosis not present

## 2024-12-01 DIAGNOSIS — Z79899 Other long term (current) drug therapy: Secondary | ICD-10-CM | POA: Diagnosis not present

## 2024-12-01 DIAGNOSIS — R7989 Other specified abnormal findings of blood chemistry: Secondary | ICD-10-CM | POA: Diagnosis not present

## 2024-12-01 DIAGNOSIS — R7881 Bacteremia: Secondary | ICD-10-CM | POA: Diagnosis not present

## 2024-12-01 DIAGNOSIS — I129 Hypertensive chronic kidney disease with stage 1 through stage 4 chronic kidney disease, or unspecified chronic kidney disease: Secondary | ICD-10-CM | POA: Diagnosis not present

## 2024-12-01 DIAGNOSIS — E785 Hyperlipidemia, unspecified: Secondary | ICD-10-CM | POA: Diagnosis not present

## 2024-12-01 DIAGNOSIS — E1122 Type 2 diabetes mellitus with diabetic chronic kidney disease: Secondary | ICD-10-CM | POA: Diagnosis not present

## 2024-12-01 DIAGNOSIS — J189 Pneumonia, unspecified organism: Secondary | ICD-10-CM | POA: Diagnosis not present

## 2024-12-01 DIAGNOSIS — F32A Depression, unspecified: Secondary | ICD-10-CM | POA: Diagnosis not present

## 2024-12-01 DIAGNOSIS — N1832 Chronic kidney disease, stage 3b: Secondary | ICD-10-CM | POA: Diagnosis not present

## 2024-12-01 DIAGNOSIS — R8290 Unspecified abnormal findings in urine: Secondary | ICD-10-CM | POA: Diagnosis not present
